# Patient Record
Sex: Female | Born: 1980 | Hispanic: No | Marital: Married | Smoking: Current every day smoker
Health system: Southern US, Community
[De-identification: ages and names within clinical notes are randomized; demographics above are authoritative.]

## PROBLEM LIST (undated history)

## (undated) DIAGNOSIS — R51 Headache: Secondary | ICD-10-CM

## (undated) DIAGNOSIS — F419 Anxiety disorder, unspecified: Secondary | ICD-10-CM

## (undated) DIAGNOSIS — C55 Malignant neoplasm of uterus, part unspecified: Secondary | ICD-10-CM

## (undated) DIAGNOSIS — R519 Headache, unspecified: Secondary | ICD-10-CM

## (undated) DIAGNOSIS — B019 Varicella without complication: Secondary | ICD-10-CM

## (undated) DIAGNOSIS — O139 Gestational [pregnancy-induced] hypertension without significant proteinuria, unspecified trimester: Secondary | ICD-10-CM

## (undated) DIAGNOSIS — K219 Gastro-esophageal reflux disease without esophagitis: Secondary | ICD-10-CM

## (undated) DIAGNOSIS — K5900 Constipation, unspecified: Secondary | ICD-10-CM

## (undated) DIAGNOSIS — D649 Anemia, unspecified: Secondary | ICD-10-CM

## (undated) DIAGNOSIS — Q270 Congenital absence and hypoplasia of umbilical artery: Secondary | ICD-10-CM

## (undated) DIAGNOSIS — M5430 Sciatica, unspecified side: Secondary | ICD-10-CM

## (undated) HISTORY — DX: Constipation, unspecified: K59.00

## (undated) HISTORY — PX: NO PAST SURGERIES: SHX2092

## (undated) HISTORY — DX: Varicella without complication: B01.9

## (undated) HISTORY — DX: Gastro-esophageal reflux disease without esophagitis: K21.9

---

## 2006-12-20 ENCOUNTER — Emergency Department (HOSPITAL_COMMUNITY): Admission: EM | Admit: 2006-12-20 | Discharge: 2006-12-20 | Payer: Self-pay | Admitting: Emergency Medicine

## 2007-03-10 ENCOUNTER — Emergency Department (HOSPITAL_COMMUNITY): Admission: EM | Admit: 2007-03-10 | Discharge: 2007-03-10 | Payer: Self-pay | Admitting: Family Medicine

## 2007-07-27 ENCOUNTER — Emergency Department (HOSPITAL_COMMUNITY): Admission: EM | Admit: 2007-07-27 | Discharge: 2007-07-27 | Payer: Self-pay | Admitting: Emergency Medicine

## 2007-10-10 ENCOUNTER — Emergency Department (HOSPITAL_COMMUNITY): Admission: EM | Admit: 2007-10-10 | Discharge: 2007-10-10 | Payer: Self-pay | Admitting: Emergency Medicine

## 2007-11-12 ENCOUNTER — Emergency Department (HOSPITAL_COMMUNITY): Admission: EM | Admit: 2007-11-12 | Discharge: 2007-11-12 | Payer: Self-pay | Admitting: Emergency Medicine

## 2008-09-20 ENCOUNTER — Emergency Department (HOSPITAL_COMMUNITY): Admission: EM | Admit: 2008-09-20 | Discharge: 2008-09-20 | Payer: Self-pay | Admitting: Emergency Medicine

## 2008-11-10 ENCOUNTER — Emergency Department (HOSPITAL_COMMUNITY): Admission: EM | Admit: 2008-11-10 | Discharge: 2008-11-11 | Payer: Self-pay | Admitting: Emergency Medicine

## 2009-04-23 ENCOUNTER — Encounter: Admission: RE | Admit: 2009-04-23 | Discharge: 2009-04-23 | Payer: Self-pay | Admitting: Cardiovascular Disease

## 2009-05-22 ENCOUNTER — Emergency Department (HOSPITAL_COMMUNITY): Admission: EM | Admit: 2009-05-22 | Discharge: 2009-05-22 | Payer: Self-pay | Admitting: Emergency Medicine

## 2009-12-24 ENCOUNTER — Emergency Department (HOSPITAL_COMMUNITY): Admission: EM | Admit: 2009-12-24 | Discharge: 2009-12-24 | Payer: Self-pay | Admitting: Emergency Medicine

## 2010-01-03 ENCOUNTER — Inpatient Hospital Stay (HOSPITAL_COMMUNITY): Admission: AD | Admit: 2010-01-03 | Discharge: 2010-01-03 | Payer: Self-pay | Admitting: Family Medicine

## 2010-02-24 ENCOUNTER — Inpatient Hospital Stay (HOSPITAL_COMMUNITY)
Admission: AD | Admit: 2010-02-24 | Discharge: 2010-02-24 | Payer: Self-pay | Source: Home / Self Care | Attending: Obstetrics & Gynecology | Admitting: Obstetrics & Gynecology

## 2010-03-28 ENCOUNTER — Inpatient Hospital Stay (HOSPITAL_COMMUNITY)
Admission: AD | Admit: 2010-03-28 | Discharge: 2010-03-29 | Payer: Self-pay | Source: Home / Self Care | Attending: Obstetrics and Gynecology | Admitting: Obstetrics and Gynecology

## 2010-03-29 LAB — RPR: RPR Ser Ql: NONREACTIVE

## 2010-03-29 LAB — CBC
MCV: 89.7 fL (ref 78.0–100.0)
Platelets: 252 10*3/uL (ref 150–400)
RBC: 3.31 MIL/uL — ABNORMAL LOW (ref 3.87–5.11)
RDW: 12.5 % (ref 11.5–15.5)
WBC: 8.7 10*3/uL (ref 4.0–10.5)

## 2010-03-29 LAB — DIFFERENTIAL
Basophils Relative: 0 % (ref 0–1)
Eosinophils Absolute: 0.1 10*3/uL (ref 0.0–0.7)
Eosinophils Relative: 1 % (ref 0–5)
Lymphs Abs: 1.9 10*3/uL (ref 0.7–4.0)
Neutrophils Relative %: 72 % (ref 43–77)

## 2010-03-29 LAB — RUBELLA SCREEN: Rubella: 26.1 IU/mL — ABNORMAL HIGH

## 2010-04-15 ENCOUNTER — Ambulatory Visit: Payer: Medicaid Other

## 2010-04-15 ENCOUNTER — Encounter: Payer: Self-pay | Admitting: Obstetrics & Gynecology

## 2010-04-15 DIAGNOSIS — O0930 Supervision of pregnancy with insufficient antenatal care, unspecified trimester: Secondary | ICD-10-CM

## 2010-04-15 LAB — CONVERTED CEMR LAB: HIV: NONREACTIVE

## 2010-04-16 ENCOUNTER — Other Ambulatory Visit: Payer: Self-pay | Admitting: Obstetrics and Gynecology

## 2010-04-16 ENCOUNTER — Other Ambulatory Visit: Payer: Self-pay

## 2010-04-16 DIAGNOSIS — O0930 Supervision of pregnancy with insufficient antenatal care, unspecified trimester: Secondary | ICD-10-CM

## 2010-04-16 LAB — POCT URINALYSIS DIPSTICK
Hgb urine dipstick: NEGATIVE
Nitrite: NEGATIVE
Protein, ur: NEGATIVE mg/dL
Specific Gravity, Urine: >=1.03 (ref 1.005–1.030)
Urine Glucose, Fasting: NEGATIVE mg/dL
Urobilinogen, UA: 0.2 mg/dL (ref 0.0–1.0)

## 2010-04-24 ENCOUNTER — Encounter: Payer: Self-pay | Admitting: Obstetrics and Gynecology

## 2010-04-24 DIAGNOSIS — Z0189 Encounter for other specified special examinations: Secondary | ICD-10-CM

## 2010-04-30 ENCOUNTER — Other Ambulatory Visit: Payer: Self-pay

## 2010-04-30 DIAGNOSIS — O0930 Supervision of pregnancy with insufficient antenatal care, unspecified trimester: Secondary | ICD-10-CM

## 2010-04-30 LAB — POCT URINALYSIS DIPSTICK
Hgb urine dipstick: NEGATIVE
Nitrite: NEGATIVE
Protein, ur: 30 mg/dL — AB
Specific Gravity, Urine: >=1.03 (ref 1.005–1.030)
Urobilinogen, UA: 0.2 mg/dL (ref 0.0–1.0)

## 2010-05-08 ENCOUNTER — Inpatient Hospital Stay (HOSPITAL_COMMUNITY)
Admission: AD | Admit: 2010-05-08 | Discharge: 2010-05-08 | Disposition: A | Discharge status: Home / Self Care | Payer: Medicaid Other | Source: Ambulatory Visit | Attending: Obstetrics & Gynecology | Admitting: Obstetrics & Gynecology

## 2010-05-08 DIAGNOSIS — O4700 False labor before 37 completed weeks of gestation, unspecified trimester: Secondary | ICD-10-CM | POA: Insufficient documentation

## 2010-05-13 LAB — URINALYSIS, ROUTINE W REFLEX MICROSCOPIC
Bilirubin Urine: NEGATIVE
Hgb urine dipstick: NEGATIVE
Ketones, ur: NEGATIVE mg/dL
Specific Gravity, Urine: >1.03 — ABNORMAL HIGH (ref 1.005–1.030)
pH: 5.5 (ref 5.0–8.0)

## 2010-05-13 LAB — APTT: aPTT: 30 seconds (ref 24–37)

## 2010-05-13 LAB — HCG, QUANTITATIVE, PREGNANCY: hCG, Beta Chain, Quant, S: 11870 m[IU]/mL — ABNORMAL HIGH (ref <5)

## 2010-05-13 LAB — GC/CHLAMYDIA PROBE AMP, GENITAL
Chlamydia, DNA Probe: NEGATIVE
GC Probe Amp, Genital: NEGATIVE

## 2010-05-13 LAB — WET PREP, GENITAL
Clue Cells Wet Prep HPF POC: NONE SEEN
Trich, Wet Prep: NONE SEEN
Yeast Wet Prep HPF POC: NONE SEEN

## 2010-05-13 LAB — CBC
HCT: 32.6 % — ABNORMAL LOW (ref 36.0–46.0)
Hemoglobin: 11.2 g/dL — ABNORMAL LOW (ref 12.0–15.0)
MCH: 32.1 pg (ref 26.0–34.0)
MCV: 93.7 fL (ref 78.0–100.0)
RBC: 3.48 MIL/uL — ABNORMAL LOW (ref 3.87–5.11)

## 2010-05-13 LAB — COMPREHENSIVE METABOLIC PANEL
AST: 20 U/L (ref 0–37)
BUN: 15 mg/dL (ref 6–23)
CO2: 24 mEq/L (ref 19–32)
Chloride: 103 mEq/L (ref 96–112)
Creatinine, Ser: 0.44 mg/dL (ref 0.4–1.2)
GFR calc non Af Amer: >60 mL/min (ref >60)
Glucose, Bld: 83 mg/dL (ref 70–99)
Total Bilirubin: 0.3 mg/dL (ref 0.3–1.2)

## 2010-05-13 LAB — PROTIME-INR
INR: 0.93 (ref 0.00–1.49)
Prothrombin Time: 12.7 s (ref 11.6–15.2)

## 2010-05-13 LAB — ABO/RH: ABO/RH(D): A POS

## 2010-05-13 LAB — TYPE AND SCREEN

## 2010-05-14 ENCOUNTER — Encounter: Payer: Self-pay | Admitting: Obstetrics and Gynecology

## 2010-05-14 ENCOUNTER — Other Ambulatory Visit: Payer: Self-pay

## 2010-05-14 DIAGNOSIS — O0930 Supervision of pregnancy with insufficient antenatal care, unspecified trimester: Secondary | ICD-10-CM

## 2010-05-14 LAB — POCT URINALYSIS DIPSTICK
Glucose, UA: NEGATIVE mg/dL
Hgb urine dipstick: NEGATIVE
Specific Gravity, Urine: 1.025 (ref 1.005–1.030)
Urobilinogen, UA: 1 mg/dL (ref 0.0–1.0)

## 2010-05-14 LAB — CONVERTED CEMR LAB: GC Probe Amp, Genital: NEGATIVE

## 2010-05-15 ENCOUNTER — Encounter: Payer: Self-pay | Admitting: Obstetrics and Gynecology

## 2010-05-21 ENCOUNTER — Other Ambulatory Visit: Payer: Self-pay

## 2010-05-21 DIAGNOSIS — O368190 Decreased fetal movements, unspecified trimester, not applicable or unspecified: Secondary | ICD-10-CM

## 2010-05-21 DIAGNOSIS — O0930 Supervision of pregnancy with insufficient antenatal care, unspecified trimester: Secondary | ICD-10-CM

## 2010-05-21 LAB — POCT URINALYSIS DIP (DEVICE)
Glucose, UA: NEGATIVE mg/dL
Protein, ur: 30 mg/dL — AB
Specific Gravity, Urine: >=1.03 (ref 1.005–1.030)

## 2010-05-26 LAB — CBC
Hemoglobin: 11.5 g/dL — ABNORMAL LOW (ref 12.0–15.0)
RBC: 3.63 MIL/uL — ABNORMAL LOW (ref 3.87–5.11)
WBC: 6.2 10*3/uL (ref 4.0–10.5)

## 2010-05-26 LAB — WET PREP, GENITAL
WBC, Wet Prep HPF POC: NONE SEEN
Yeast Wet Prep HPF POC: NONE SEEN

## 2010-05-26 LAB — URINALYSIS, ROUTINE W REFLEX MICROSCOPIC
Glucose, UA: NEGATIVE mg/dL
Specific Gravity, Urine: 1.027 (ref 1.005–1.030)
pH: 7.5 (ref 5.0–8.0)

## 2010-05-26 LAB — POCT PREGNANCY, URINE: Preg Test, Ur: POSITIVE

## 2010-05-26 LAB — ABO/RH: ABO/RH(D): A POS

## 2010-05-28 ENCOUNTER — Other Ambulatory Visit: Payer: Self-pay | Admitting: Obstetrics and Gynecology

## 2010-05-28 ENCOUNTER — Encounter: Payer: Self-pay | Admitting: Family

## 2010-05-28 DIAGNOSIS — Z331 Pregnant state, incidental: Secondary | ICD-10-CM

## 2010-05-28 DIAGNOSIS — O0930 Supervision of pregnancy with insufficient antenatal care, unspecified trimester: Secondary | ICD-10-CM

## 2010-05-28 LAB — POCT URINALYSIS DIP (DEVICE)
Hgb urine dipstick: NEGATIVE
Nitrite: POSITIVE — AB
Specific Gravity, Urine: >=1.03 (ref 1.005–1.030)
Urobilinogen, UA: 1 mg/dL (ref 0.0–1.0)
pH: 6 (ref 5.0–8.0)

## 2010-06-03 ENCOUNTER — Inpatient Hospital Stay (HOSPITAL_COMMUNITY)
Admission: AD | Admit: 2010-06-03 | Discharge: 2010-06-06 | DRG: 775 | Disposition: A | Discharge status: Home / Self Care | Payer: Medicaid Other | Source: Ambulatory Visit | Attending: Obstetrics & Gynecology | Admitting: Obstetrics & Gynecology

## 2010-06-03 DIAGNOSIS — O4290 Premature rupture of membranes, unspecified as to length of time between rupture and onset of labor, unspecified weeks of gestation: Principal | ICD-10-CM | POA: Diagnosis present

## 2010-06-03 LAB — CBC
Hemoglobin: 9.6 g/dL — ABNORMAL LOW (ref 12.0–15.0)
MCH: 29.2 pg (ref 26.0–34.0)
MCHC: 32.9 g/dL (ref 30.0–36.0)
MCV: 88.8 fL (ref 78.0–100.0)

## 2010-06-04 DIAGNOSIS — O4290 Premature rupture of membranes, unspecified as to length of time between rupture and onset of labor, unspecified weeks of gestation: Secondary | ICD-10-CM

## 2010-06-04 LAB — RPR: RPR Ser Ql: NONREACTIVE

## 2010-06-06 LAB — GC/CHLAMYDIA PROBE AMP, GENITAL: GC Probe Amp, Genital: NEGATIVE

## 2010-06-06 LAB — COMPREHENSIVE METABOLIC PANEL
BUN: 20 mg/dL (ref 6–23)
CO2: 25 mEq/L (ref 19–32)
Calcium: 9.1 mg/dL (ref 8.4–10.5)
Chloride: 101 mEq/L (ref 96–112)
Creatinine, Ser: 0.6 mg/dL (ref 0.4–1.2)
GFR calc Af Amer: >60 mL/min (ref >60)
GFR calc non Af Amer: >60 mL/min (ref >60)
Glucose, Bld: 101 mg/dL — ABNORMAL HIGH (ref 70–99)
Total Bilirubin: 0.6 mg/dL (ref 0.3–1.2)

## 2010-06-06 LAB — DIFFERENTIAL
Basophils Absolute: 0.1 10*3/uL (ref 0.0–0.1)
Eosinophils Relative: 1 % (ref 0–5)
Lymphocytes Relative: 23 % (ref 12–46)
Lymphs Abs: 2.1 10*3/uL (ref 0.7–4.0)
Neutro Abs: 6.4 10*3/uL (ref 1.7–7.7)
Neutrophils Relative %: 70 % (ref 43–77)

## 2010-06-06 LAB — URINALYSIS, ROUTINE W REFLEX MICROSCOPIC
Bilirubin Urine: NEGATIVE
Hgb urine dipstick: NEGATIVE
Ketones, ur: NEGATIVE mg/dL
Nitrite: NEGATIVE
pH: 6 (ref 5.0–8.0)

## 2010-06-06 LAB — CBC
HCT: 39.8 % (ref 36.0–46.0)
Hemoglobin: 13.4 g/dL (ref 12.0–15.0)
MCHC: 33.6 g/dL (ref 30.0–36.0)
MCV: 93.8 fL (ref 78.0–100.0)
RBC: 4.24 MIL/uL (ref 3.87–5.11)
WBC: 9.1 10*3/uL (ref 4.0–10.5)

## 2010-06-06 LAB — WET PREP, GENITAL
Trich, Wet Prep: NONE SEEN
WBC, Wet Prep HPF POC: NONE SEEN
Yeast Wet Prep HPF POC: NONE SEEN

## 2010-06-08 ENCOUNTER — Inpatient Hospital Stay (HOSPITAL_COMMUNITY)
Admission: AD | Admit: 2010-06-08 | Discharge: 2010-06-08 | Disposition: A | Discharge status: Home / Self Care | Payer: Medicaid Other | Source: Ambulatory Visit | Attending: Obstetrics & Gynecology | Admitting: Obstetrics & Gynecology

## 2010-06-08 DIAGNOSIS — K5900 Constipation, unspecified: Secondary | ICD-10-CM | POA: Insufficient documentation

## 2010-06-08 DIAGNOSIS — O99893 Other specified diseases and conditions complicating puerperium: Secondary | ICD-10-CM | POA: Insufficient documentation

## 2010-06-09 ENCOUNTER — Emergency Department (HOSPITAL_COMMUNITY): Payer: Medicaid Other

## 2010-06-09 ENCOUNTER — Emergency Department (HOSPITAL_COMMUNITY)
Admission: EM | Admit: 2010-06-09 | Discharge: 2010-06-09 | Disposition: A | Discharge status: Home / Self Care | Payer: Medicaid Other | Attending: Emergency Medicine | Admitting: Emergency Medicine

## 2010-06-09 DIAGNOSIS — K5900 Constipation, unspecified: Secondary | ICD-10-CM | POA: Insufficient documentation

## 2010-06-09 DIAGNOSIS — R110 Nausea: Secondary | ICD-10-CM | POA: Insufficient documentation

## 2010-06-09 DIAGNOSIS — R109 Unspecified abdominal pain: Secondary | ICD-10-CM | POA: Insufficient documentation

## 2010-06-09 LAB — DIFFERENTIAL
Basophils Absolute: 0 10*3/uL (ref 0.0–0.1)
Lymphocytes Relative: 28 % (ref 12–46)
Lymphs Abs: 2.1 10*3/uL (ref 0.7–4.0)
Monocytes Absolute: 0.6 10*3/uL (ref 0.1–1.0)
Neutro Abs: 4.7 10*3/uL (ref 1.7–7.7)

## 2010-06-09 LAB — POCT I-STAT, CHEM 8
Chloride: 110 mEq/L (ref 96–112)
Creatinine, Ser: 0.6 mg/dL (ref 0.4–1.2)
Hemoglobin: 10.2 g/dL — ABNORMAL LOW (ref 12.0–15.0)
Potassium: 3.6 mEq/L (ref 3.5–5.1)
Sodium: 142 mEq/L (ref 135–145)

## 2010-06-09 LAB — CBC
HCT: 31.6 % — ABNORMAL LOW (ref 36.0–46.0)
Hemoglobin: 10.2 g/dL — ABNORMAL LOW (ref 12.0–15.0)
MCHC: 32.3 g/dL (ref 30.0–36.0)
WBC: 7.5 10*3/uL (ref 4.0–10.5)

## 2010-11-26 LAB — DIFFERENTIAL
Lymphocytes Relative: 26
Lymphs Abs: 2
Neutrophils Relative %: 68

## 2010-11-26 LAB — WET PREP, GENITAL
Trich, Wet Prep: NONE SEEN
WBC, Wet Prep HPF POC: NONE SEEN
Yeast Wet Prep HPF POC: NONE SEEN

## 2010-11-26 LAB — RPR: RPR Ser Ql: NONREACTIVE

## 2010-11-26 LAB — CBC
Platelets: 244
WBC: 7.9

## 2010-11-26 LAB — POCT PREGNANCY, URINE: Operator id: 272551

## 2010-11-26 LAB — GC/CHLAMYDIA PROBE AMP, GENITAL: GC Probe Amp, Genital: NEGATIVE

## 2010-12-03 LAB — POCT I-STAT, CHEM 8
BUN: 23
Creatinine, Ser: 0.7
Sodium: 139
TCO2: 21

## 2010-12-03 LAB — URINALYSIS, ROUTINE W REFLEX MICROSCOPIC
Bilirubin Urine: NEGATIVE
Ketones, ur: NEGATIVE
Nitrite: NEGATIVE
Urobilinogen, UA: 0.2

## 2010-12-03 LAB — PREGNANCY, URINE: Preg Test, Ur: NEGATIVE

## 2012-02-12 ENCOUNTER — Emergency Department (HOSPITAL_COMMUNITY)
Admission: EM | Admit: 2012-02-12 | Discharge: 2012-02-13 | Disposition: A | Discharge status: Home / Self Care | Payer: 59 | Attending: Emergency Medicine | Admitting: Emergency Medicine

## 2012-02-12 ENCOUNTER — Encounter (HOSPITAL_COMMUNITY): Payer: Self-pay | Admitting: *Deleted

## 2012-02-12 DIAGNOSIS — K529 Noninfective gastroenteritis and colitis, unspecified: Secondary | ICD-10-CM

## 2012-02-12 DIAGNOSIS — K5289 Other specified noninfective gastroenteritis and colitis: Secondary | ICD-10-CM | POA: Insufficient documentation

## 2012-02-12 DIAGNOSIS — R197 Diarrhea, unspecified: Secondary | ICD-10-CM | POA: Insufficient documentation

## 2012-02-12 DIAGNOSIS — R51 Headache: Secondary | ICD-10-CM | POA: Insufficient documentation

## 2012-02-12 DIAGNOSIS — R112 Nausea with vomiting, unspecified: Secondary | ICD-10-CM | POA: Insufficient documentation

## 2012-02-12 LAB — PREGNANCY, URINE: Preg Test, Ur: NEGATIVE

## 2012-02-12 LAB — CBC WITH DIFFERENTIAL/PLATELET
HCT: 38 % (ref 36.0–46.0)
Hemoglobin: 12.6 g/dL (ref 12.0–15.0)
Lymphocytes Relative: 27 % (ref 12–46)
Lymphs Abs: 2.7 10*3/uL (ref 0.7–4.0)
MCHC: 33.2 g/dL (ref 30.0–36.0)
Monocytes Absolute: 0.5 10*3/uL (ref 0.1–1.0)
Monocytes Relative: 6 % (ref 3–12)
Neutro Abs: 6.3 10*3/uL (ref 1.7–7.7)
RBC: 4.28 MIL/uL (ref 3.87–5.11)
WBC: 9.7 10*3/uL (ref 4.0–10.5)

## 2012-02-12 LAB — COMPREHENSIVE METABOLIC PANEL
Alkaline Phosphatase: 62 U/L (ref 39–117)
BUN: 21 mg/dL (ref 6–23)
CO2: 29 mEq/L (ref 19–32)
Chloride: 107 mEq/L (ref 96–112)
Creatinine, Ser: 0.7 mg/dL (ref 0.50–1.10)
GFR calc non Af Amer: >90 mL/min (ref >90)
Glucose, Bld: 97 mg/dL (ref 70–99)
Potassium: 3.8 mEq/L (ref 3.5–5.1)
Total Bilirubin: 0.2 mg/dL — ABNORMAL LOW (ref 0.3–1.2)

## 2012-02-12 LAB — URINALYSIS, ROUTINE W REFLEX MICROSCOPIC
Glucose, UA: NEGATIVE mg/dL
Hgb urine dipstick: NEGATIVE
Protein, ur: NEGATIVE mg/dL
pH: 5.5 (ref 5.0–8.0)

## 2012-02-12 LAB — LIPASE, BLOOD: Lipase: 31 U/L (ref 11–59)

## 2012-02-12 LAB — OCCULT BLOOD, POC DEVICE: Fecal Occult Bld: NEGATIVE

## 2012-02-12 LAB — URINE MICROSCOPIC-ADD ON

## 2012-02-12 MED ORDER — SODIUM CHLORIDE 0.9 % IV BOLUS (SEPSIS)
1000.0000 mL | Freq: Once | INTRAVENOUS | Status: AC
  Administered 2012-02-12: 1000 mL via INTRAVENOUS

## 2012-02-12 MED ORDER — ONDANSETRON HCL 4 MG/2ML IJ SOLN
4.0000 mg | Freq: Once | INTRAMUSCULAR | Status: AC
  Administered 2012-02-12: 4 mg via INTRAVENOUS
  Filled 2012-02-12: qty 2

## 2012-02-12 MED ORDER — KETOROLAC TROMETHAMINE 30 MG/ML IJ SOLN
30.0000 mg | Freq: Once | INTRAMUSCULAR | Status: AC
  Administered 2012-02-12: 30 mg via INTRAVENOUS
  Filled 2012-02-12: qty 1

## 2012-02-12 NOTE — ED Notes (Signed)
The pt has had abd cramps with nv and diarrhea.  Today her stools are black.  She is c/o a headache for a year also lmp  Nov on depo shot

## 2012-02-12 NOTE — ED Provider Notes (Signed)
History     CSN: [id number]  Arrival date & time 02/12/12  1955   First MD Initiated Contact with Patient 02/12/12 2325      Chief Complaint  Patient presents with  . Abdominal Pain    (Consider location/radiation/quality/duration/timing/severity/associated sxs/prior treatment) HPI Comments: Pt c/o n/v and watery diarrhea X 4 days ago - initially was watery, yesterday had mucuous and today had black stools - still watery with some formed element.  She has also noticed occasional coffee-ground type emesis over the last several days. Now is having headaches - headaches daily X 1 year, intially was taking tylenol, changed to ibuprofen 400mg  or less a day but does take it daily.  She is still nauseated and vomiting and has associated abdominal pain.  The pain is located in the lower abdomen bialterally, sometiems on the top of thea bdomen.  No hx of surgery.  No hx of PUD.  Has had reflux sx but does not carry a diagnosis.  Has taken zantac in the past.  Seh is now not making much urine - iti s dark  Patient is a 31 y.o. female presenting with abdominal pain. The history is provided by the patient.  Abdominal Pain The primary symptoms of the illness include abdominal pain.    History reviewed. No pertinent past medical history.  History reviewed. No pertinent past surgical history.  No family history on file.  History  Substance Use Topics  . Smoking status: Never Smoker   . Smokeless tobacco: Not on file  . Alcohol Use: No    OB History    Grav Para Term Preterm Abortions TAB SAB Ect Mult Living                  Review of Systems  Gastrointestinal: Positive for abdominal pain.  All other systems reviewed and are negative.    Allergies  Bee venom  Home Medications   Current Outpatient Rx  Name  Route  Sig  Dispense  Refill  . ACETAMINOPHEN 500 MG PO TABS   Oral   Take 1,000 mg by mouth every 6 (six) hours as needed. For headache         . IBUPROFEN 800 MG PO  TABS   Oral   Take 800 mg by mouth every 8 (eight) hours as needed. For headache         . CIPROFLOXACIN HCL 500 MG PO TABS   Oral   Take 1 tablet (500 mg total) by mouth every 12 (twelve) hours.   20 tablet   0   . METRONIDAZOLE 500 MG PO TABS   Oral   Take 1 tablet (500 mg total) by mouth 2 (two) times daily.   14 tablet   0   . ONDANSETRON 4 MG PO TBDP   Oral   Take 1 tablet (4 mg total) by mouth every 8 (eight) hours as needed for nausea.   10 tablet   0   . OXYCODONE-ACETAMINOPHEN 5-325 MG PO TABS   Oral   Take 1 tablet by mouth every 4 (four) hours as needed for pain.   20 tablet   0     BP 106/64  Pulse 85  Temp 97.6 F (36.4 C) (Oral)  Resp 11  SpO2 100%  LMP 01/14/2012  Physical Exam  Nursing note and vitals reviewed. Constitutional: She appears well-developed and well-nourished. No distress.  HENT:  Head: Normocephalic and atraumatic.  Mouth/Throat: Oropharynx is clear and moist. No oropharyngeal  exudate.       Moist mucous membranes  Eyes: Conjunctivae normal and EOM are normal. Pupils are equal, round, and reactive to light. Right eye exhibits no discharge. Left eye exhibits no discharge. No scleral icterus.  Neck: Normal range of motion. Neck supple. No JVD present. No thyromegaly present.  Cardiovascular: Regular rhythm, normal heart sounds and intact distal pulses.  Exam reveals no gallop and no friction rub.   No murmur heard.      Mild tachycardia, strong peripheral pulses  Pulmonary/Chest: Effort normal and breath sounds normal. No respiratory distress. She has no wheezes. She has no rales.  Abdominal: Soft. Bowel sounds are normal. She exhibits no distension and no mass. There is tenderness ( Very soft abdomen, no peritoneal signs, diffuse abdominal tenderness without guarding or masses).  Genitourinary:       Chaperone present for entire exam, rectal exam performed, no fissures, hemorrhoids, masses or tenderness. Stool was mucousy and  greenish black, scant amount of stool in the rectal vault, no formed stool  Musculoskeletal: Normal range of motion. She exhibits no edema and no tenderness.  Lymphadenopathy:    She has no cervical adenopathy.  Neurological: She is alert. Coordination normal.       Pupils are equal round reactive, cranial nerves III through XII are intact, the patient has a normal steady gait, moves all strumming is x4 without ataxia or weakness, speech is clear, memory is intact.  Skin: Skin is warm and dry. No rash noted. No erythema.  Psychiatric: She has a normal mood and affect. Her behavior is normal.    ED Course  Procedures (including critical care time)  Labs Reviewed  URINALYSIS, ROUTINE W REFLEX MICROSCOPIC - Abnormal; Notable for the following:    Specific Gravity, Urine 1.036 (*)     Ketones, ur 15 (*)     Leukocytes, UA TRACE (*)     All other components within normal limits  COMPREHENSIVE METABOLIC PANEL - Abnormal; Notable for the following:    Total Bilirubin 0.2 (*)     All other components within normal limits  URINE MICROSCOPIC-ADD ON - Abnormal; Notable for the following:    Squamous Epithelial / LPF FEW (*)     Bacteria, UA MANY (*)     All other components within normal limits  PREGNANCY, URINE  CBC WITH DIFFERENTIAL  LIPASE, BLOOD  OCCULT BLOOD, POC DEVICE  URINE CULTURE  OCCULT BLOOD X 1 CARD TO LAB, STOOL   Ct Head Wo Contrast  02/13/2012  *RADIOLOGY REPORT*  Clinical Data: Daily headaches over past year getting worse  CT HEAD WITHOUT CONTRAST  Technique:  Contiguous axial images were obtained from the base of the skull through the vertex without contrast.  Comparison: 11/12/2007  Findings: Normal ventricular morphology. No midline shift or mass effect. Normal appearance of brain parenchyma. No intracranial hemorrhage, mass lesion, or acute infarction. Visualized paranasal sinuses and mastoid air cells clear. Bones unremarkable.  IMPRESSION: No acute intracranial  abnormalities.   Original Report Authenticated By: Ulyses Southward, M.D.      1. Colitis       MDM  The patient by history has had a gastroenteritis with a colitis and now appears to be having dark-colored stools consistent with an upper GI bleed. This would be consistent with her history of record NSAID use as well as frequent and recurrent vomiting over the last couple of days. IV fluids, and IV antiemetics.  The patient also complains of a constant headache  daily over the last year which is gradually getting worse. She is not complaining of any neurologic abnormalities, she was told by her employer at the urgent care that she did have an MRI which she has not yet arranged.   The patient has been reevaluated, she no longer has any abdominal pain. Due to her history of anti-inflammatory use and abnormal stools I will start her on Pepcid for stomach acid, she will be sent home with to grow, Flagyl, Percocet and Zofran for her other symptoms which is likely related to colitis. Her vital signs are normal, pulse is less than 100 and blood pressure is normal as well. She states that she feels much better and will return should her symptoms worsen.  CT scan of the brain normal without any signs of intracranial abnormalities.  Vida Roller, MD 02/13/12 [phone]

## 2012-02-12 NOTE — ED Notes (Signed)
MD at bedside.miller 

## 2012-02-13 ENCOUNTER — Emergency Department (HOSPITAL_COMMUNITY): Payer: 59

## 2012-02-13 MED ORDER — METRONIDAZOLE 500 MG PO TABS: 500.0000 mg | ORAL_TABLET | Freq: Two times a day (BID) | ORAL | Status: DC

## 2012-02-13 MED ORDER — ACETAMINOPHEN 500 MG PO TABS: 1000.0000 mg | ORAL_TABLET | Freq: Once | ORAL | Status: DC

## 2012-02-13 MED ORDER — HYDROMORPHONE HCL PF 1 MG/ML IJ SOLN
1.0000 mg | Freq: Once | INTRAMUSCULAR | Status: AC
  Administered 2012-02-13: 1 mg via INTRAVENOUS
  Filled 2012-02-13: qty 1

## 2012-02-13 MED ORDER — ONDANSETRON 4 MG PO TBDP: 4.0000 mg | ORAL_TABLET | Freq: Three times a day (TID) | ORAL | Status: DC | PRN

## 2012-02-13 MED ORDER — FAMOTIDINE 20 MG PO TABS: 20.0000 mg | ORAL_TABLET | Freq: Two times a day (BID) | ORAL | Status: DC

## 2012-02-13 MED ORDER — CIPROFLOXACIN HCL 500 MG PO TABS: 500.0000 mg | ORAL_TABLET | Freq: Two times a day (BID) | ORAL | Status: DC

## 2012-02-13 MED ORDER — METRONIDAZOLE 500 MG PO TABS
500.0000 mg | ORAL_TABLET | Freq: Once | ORAL | Status: AC
  Administered 2012-02-13: 500 mg via ORAL
  Filled 2012-02-13: qty 1

## 2012-02-13 MED ORDER — CIPROFLOXACIN IN D5W 400 MG/200ML IV SOLN
400.0000 mg | Freq: Once | INTRAVENOUS | Status: AC
  Administered 2012-02-13: 400 mg via INTRAVENOUS
  Filled 2012-02-13: qty 200

## 2012-02-13 MED ORDER — OXYCODONE-ACETAMINOPHEN 5-325 MG PO TABS: 1.0000 | ORAL_TABLET | ORAL | Status: DC | PRN

## 2012-02-13 NOTE — ED Notes (Signed)
Patient transported to CT 

## 2012-02-14 LAB — URINE CULTURE: Colony Count: 30000

## 2013-02-27 ENCOUNTER — Encounter (HOSPITAL_COMMUNITY): Payer: Self-pay | Admitting: Emergency Medicine

## 2013-02-27 ENCOUNTER — Emergency Department (HOSPITAL_COMMUNITY)
Admission: EM | Admit: 2013-02-27 | Discharge: 2013-02-28 | Disposition: A | Discharge status: Home / Self Care | Payer: 59 | Attending: Emergency Medicine | Admitting: Emergency Medicine

## 2013-02-27 DIAGNOSIS — Z3490 Encounter for supervision of normal pregnancy, unspecified, unspecified trimester: Secondary | ICD-10-CM

## 2013-02-27 DIAGNOSIS — R109 Unspecified abdominal pain: Secondary | ICD-10-CM | POA: Insufficient documentation

## 2013-02-27 DIAGNOSIS — O219 Vomiting of pregnancy, unspecified: Secondary | ICD-10-CM

## 2013-02-27 DIAGNOSIS — O210 Mild hyperemesis gravidarum: Secondary | ICD-10-CM | POA: Insufficient documentation

## 2013-02-27 DIAGNOSIS — R42 Dizziness and giddiness: Secondary | ICD-10-CM | POA: Insufficient documentation

## 2013-02-27 LAB — CBC WITH DIFFERENTIAL/PLATELET
Eosinophils Absolute: 0.1 10*3/uL (ref 0.0–0.7)
Eosinophils Relative: 1 % (ref 0–5)
HCT: 37.3 % (ref 36.0–46.0)
Hemoglobin: 12.4 g/dL (ref 12.0–15.0)
Lymphocytes Relative: 21 % (ref 12–46)
Lymphs Abs: 2.2 10*3/uL (ref 0.7–4.0)
MCH: 29.2 pg (ref 26.0–34.0)
MCV: 88 fL (ref 78.0–100.0)
Monocytes Absolute: 0.5 10*3/uL (ref 0.1–1.0)
Monocytes Relative: 5 % (ref 3–12)
Platelets: 290 10*3/uL (ref 150–400)
RBC: 4.24 MIL/uL (ref 3.87–5.11)

## 2013-02-27 LAB — URINALYSIS, ROUTINE W REFLEX MICROSCOPIC
Bilirubin Urine: NEGATIVE
Glucose, UA: NEGATIVE mg/dL
Ketones, ur: NEGATIVE mg/dL
Protein, ur: NEGATIVE mg/dL
pH: 5.5 (ref 5.0–8.0)

## 2013-02-27 LAB — COMPREHENSIVE METABOLIC PANEL
AST: 12 U/L (ref 0–37)
Albumin: 4 g/dL (ref 3.5–5.2)
Chloride: 103 mEq/L (ref 96–112)
Creatinine, Ser: 0.63 mg/dL (ref 0.50–1.10)
Total Bilirubin: 0.2 mg/dL — ABNORMAL LOW (ref 0.3–1.2)
Total Protein: 7.6 g/dL (ref 6.0–8.3)

## 2013-02-27 LAB — URINE MICROSCOPIC-ADD ON

## 2013-02-27 LAB — LIPASE, BLOOD: Lipase: 25 U/L (ref 11–59)

## 2013-02-27 NOTE — ED Notes (Signed)
sts for the past few week sts dizziness, HA, vomiting, swelling in feet and ankles. sts bruising easy and gaining weight. sts lethargic. Pt has multiple complaints.

## 2013-02-28 MED ORDER — PRENATAL COMPLETE 14-0.4 MG PO TABS: 1.0000 | ORAL_TABLET | Freq: Every day | ORAL | Status: DC

## 2013-02-28 MED ORDER — ONDANSETRON HCL 4 MG PO TABS: 4.0000 mg | ORAL_TABLET | Freq: Four times a day (QID) | ORAL | Status: DC

## 2013-02-28 NOTE — ED Provider Notes (Signed)
CSN: [id number]     Arrival date & time 02/27/13  1735 History   First MD Initiated Contact with Patient 02/28/13 0005     Chief Complaint  Patient presents with  . Vomiting  . Dizziness  . Abdominal Cramping   (Consider location/radiation/quality/duration/timing/severity/associated sxs/prior Treatment) Patient is a 32 y.o. female presenting with dizziness and cramps. The history is provided by the patient.  Dizziness Abdominal Cramping  She has been having lightheadedness, nausea, vomiting intermittently over the last several weeks but it has been getting worse. She does notice some swelling in her ankles. She is noted that she is bruising more easily. She is worried that she is going through premature menopause. Last menses was November 20 and was shorter than normal. Last normal menses was in October. She had been on Depo-Provera but last dose was in April. She has 5 children with no miscarriages or abortions. She's not had anything to treat her current symptoms. Denies fever or chills. Denies chest pain.  History reviewed. No pertinent past medical history. History reviewed. No pertinent past surgical history. History reviewed. No pertinent family history. History  Substance Use Topics  . Smoking status: Never Smoker   . Smokeless tobacco: Not on file  . Alcohol Use: No   OB History   Grav Para Term Preterm Abortions TAB SAB Ect Mult Living                 Review of Systems  Neurological: Positive for dizziness.  All other systems reviewed and are negative.    Allergies  Bee venom and Adhesive  Home Medications   Current Outpatient Rx  Name  Route  Sig  Dispense  Refill  . acetaminophen (TYLENOL) 500 MG tablet   Oral   Take 1,000 mg by mouth every 6 (six) hours as needed. For headache         . ibuprofen (ADVIL,MOTRIN) 800 MG tablet   Oral   Take 800 mg by mouth every 8 (eight) hours as needed. For headache          BP 129/70  Pulse 121  Temp(Src) 98.8  F (37.1 C)  Resp 18  Ht 5\' 4"  (1.626 m)  Wt 227 lb (102.967 kg)  BMI 38.95 kg/m2  SpO2 98%  LMP 01/25/2013 Physical Exam  Nursing note and vitals reviewed.  32 year old female, resting comfortably and in no acute distress. Vital signs are normal. Oxygen saturation is 98%, which is normal. Head is normocephalic and atraumatic. PERRLA, EOMI. Oropharynx is clear. Neck is nontender and supple without adenopathy or JVD. Back is nontender and there is no CVA tenderness. Lungs are clear without rales, wheezes, or rhonchi. Chest is nontender. Heart has regular rate and rhythm without murmur. Abdomen is soft, flat, nontender without masses or hepatosplenomegaly and peristalsis is normoactive. Extremities have trace edema, full range of motion is present. Skin is warm and dry without rash. Neurologic: Mental status is normal, cranial nerves are intact, there are no motor or sensory deficits.  ED Course  Procedures (including critical care time) Labs Review Results for orders placed during the hospital encounter of 02/27/13  LIPASE, BLOOD      Result Value Range   Lipase 25  11 - 59 U/L  COMPREHENSIVE METABOLIC PANEL      Result Value Range   Sodium 138  135 - 145 mEq/L   Potassium 3.8  3.5 - 5.1 mEq/L   Chloride 103  96 - 112 mEq/L  CO2 24  19 - 32 mEq/L   Glucose, Bld 92  70 - 99 mg/dL   BUN 18  6 - 23 mg/dL   Creatinine, Ser 9.81  0.50 - 1.10 mg/dL   Calcium 8.9  8.4 - 19.1 mg/dL   Total Protein 7.6  6.0 - 8.3 g/dL   Albumin 4.0  3.5 - 5.2 g/dL   AST 12  0 - 37 U/L   ALT 12  0 - 35 U/L   Alkaline Phosphatase 79  39 - 117 U/L   Total Bilirubin 0.2 (*) 0.3 - 1.2 mg/dL   GFR calc non Af Amer >90  >90 mL/min   GFR calc Af Amer >90  >90 mL/min  CBC WITH DIFFERENTIAL      Result Value Range   WBC 10.7 (*) 4.0 - 10.5 K/uL   RBC 4.24  3.87 - 5.11 MIL/uL   Hemoglobin 12.4  12.0 - 15.0 g/dL   HCT 47.8  29.5 - 62.1 %   MCV 88.0  78.0 - 100.0 fL   MCH 29.2  26.0 - 34.0 pg   MCHC  33.2  30.0 - 36.0 g/dL   RDW 30.8  65.7 - 84.6 %   Platelets 290  150 - 400 K/uL   Neutrophils Relative % 73  43 - 77 %   Neutro Abs 7.8 (*) 1.7 - 7.7 K/uL   Lymphocytes Relative 21  12 - 46 %   Lymphs Abs 2.2  0.7 - 4.0 K/uL   Monocytes Relative 5  3 - 12 %   Monocytes Absolute 0.5  0.1 - 1.0 K/uL   Eosinophils Relative 1  0 - 5 %   Eosinophils Absolute 0.1  0.0 - 0.7 K/uL   Basophils Relative 0  0 - 1 %   Basophils Absolute 0.0  0.0 - 0.1 K/uL  URINALYSIS, ROUTINE W REFLEX MICROSCOPIC      Result Value Range   Color, Urine YELLOW  YELLOW   APPearance CLOUDY (*) CLEAR   Specific Gravity, Urine 1.026  1.005 - 1.030   pH 5.5  5.0 - 8.0   Glucose, UA NEGATIVE  NEGATIVE mg/dL   Hgb urine dipstick SMALL (*) NEGATIVE   Bilirubin Urine NEGATIVE  NEGATIVE   Ketones, ur NEGATIVE  NEGATIVE mg/dL   Protein, ur NEGATIVE  NEGATIVE mg/dL   Urobilinogen, UA 0.2  0.0 - 1.0 mg/dL   Nitrite NEGATIVE  NEGATIVE   Leukocytes, UA NEGATIVE  NEGATIVE  URINE MICROSCOPIC-ADD ON      Result Value Range   Squamous Epithelial / LPF MANY (*) RARE   WBC, UA 0-2  <3 WBC/hpf   RBC / HPF 0-2  <3 RBC/hpf   Bacteria, UA FEW (*) RARE   Urine-Other MUCOUS PRESENT    POCT PREGNANCY, URINE      Result Value Range   Preg Test, Ur POSITIVE (*) NEGATIVE    MDM   1. Pregnancy   2. Nausea/vomiting in pregnancy    Nausea and lightheadedness which apparently are secondary to pregnancy. Urine pregnancy tests come out positive. Remainder of labs are unremarkable. Patient is advised to obtain prenatal care. She's discharged with prescription for ondansetron and prenatal vitamins.    Dione Booze, MD 02/28/13 [phone]

## 2013-03-13 ENCOUNTER — Emergency Department (HOSPITAL_COMMUNITY)
Admission: EM | Admit: 2013-03-13 | Discharge: 2013-03-13 | Disposition: A | Discharge status: Home / Self Care | Payer: Medicaid Other | Attending: Emergency Medicine | Admitting: Emergency Medicine

## 2013-03-13 ENCOUNTER — Emergency Department (HOSPITAL_COMMUNITY): Payer: Medicaid Other

## 2013-03-13 ENCOUNTER — Encounter (HOSPITAL_COMMUNITY): Payer: Self-pay | Admitting: Emergency Medicine

## 2013-03-13 DIAGNOSIS — O2390 Unspecified genitourinary tract infection in pregnancy, unspecified trimester: Secondary | ICD-10-CM | POA: Insufficient documentation

## 2013-03-13 DIAGNOSIS — N390 Urinary tract infection, site not specified: Secondary | ICD-10-CM

## 2013-03-13 DIAGNOSIS — O209 Hemorrhage in early pregnancy, unspecified: Secondary | ICD-10-CM

## 2013-03-13 DIAGNOSIS — Z79899 Other long term (current) drug therapy: Secondary | ICD-10-CM | POA: Insufficient documentation

## 2013-03-13 LAB — TYPE AND SCREEN
ABO/RH(D): A POS
Antibody Screen: NEGATIVE

## 2013-03-13 LAB — BASIC METABOLIC PANEL
BUN: 14 mg/dL (ref 6–23)
CO2: 25 mEq/L (ref 19–32)
Calcium: 9 mg/dL (ref 8.4–10.5)
Chloride: 106 mEq/L (ref 96–112)
Creatinine, Ser: 0.53 mg/dL (ref 0.50–1.10)
GFR calc Af Amer: >90 mL/min (ref >90)
GFR calc non Af Amer: >90 mL/min (ref >90)
Glucose, Bld: 98 mg/dL (ref 70–99)
Potassium: 3.8 mEq/L (ref 3.7–5.3)
Sodium: 145 mEq/L (ref 137–147)

## 2013-03-13 LAB — CBC
HEMATOCRIT: 34.1 % — AB (ref 36.0–46.0)
HEMOGLOBIN: 11.4 g/dL — AB (ref 12.0–15.0)
MCH: 29 pg (ref 26.0–34.0)
MCHC: 33.4 g/dL (ref 30.0–36.0)
MCV: 86.8 fL (ref 78.0–100.0)
Platelets: 294 10*3/uL (ref 150–400)
RBC: 3.93 MIL/uL (ref 3.87–5.11)
RDW: 13.7 % (ref 11.5–15.5)
WBC: 6.5 10*3/uL (ref 4.0–10.5)

## 2013-03-13 LAB — URINALYSIS, ROUTINE W REFLEX MICROSCOPIC
Glucose, UA: NEGATIVE mg/dL
Ketones, ur: 15 mg/dL — AB
Nitrite: POSITIVE — AB
Protein, ur: 100 mg/dL — AB
Specific Gravity, Urine: 1.026 (ref 1.005–1.030)
Urobilinogen, UA: 1 mg/dL (ref 0.0–1.0)
pH: 6.5 (ref 5.0–8.0)

## 2013-03-13 LAB — URINE MICROSCOPIC-ADD ON

## 2013-03-13 LAB — HCG, QUANTITATIVE, PREGNANCY: hCG, Beta Chain, Quant, S: 732 m[IU]/mL — ABNORMAL HIGH (ref <5)

## 2013-03-13 LAB — POCT PREGNANCY, URINE: PREG TEST UR: POSITIVE — AB

## 2013-03-13 MED ORDER — DEXTROSE 5 % IV SOLN
1.0000 g | Freq: Once | INTRAVENOUS | Status: AC
  Administered 2013-03-13: 1 g via INTRAVENOUS
  Filled 2013-03-13: qty 10

## 2013-03-13 MED ORDER — CEPHALEXIN 500 MG PO CAPS: 500.0000 mg | ORAL_CAPSULE | Freq: Four times a day (QID) | ORAL | Status: DC

## 2013-03-13 NOTE — ED Notes (Signed)
Patient transported to Ultrasound 

## 2013-03-13 NOTE — ED Provider Notes (Signed)
CSN: [id number]     Arrival date & time 03/13/13  0813 History   First MD Initiated Contact with Patient 03/13/13 [phone]     Chief Complaint  Patient presents with  . Vaginal Bleeding   (Consider location/radiation/quality/duration/timing/severity/associated sxs/prior Treatment) HPI Patient presents emergency department with vaginal bleeding, that started yesterday.  The patient, states she found out that she was pregnant [redacted] weeks ago.  Patient, states she's not had any significant lower abdominal pain.  The patient denies headache, blurred vision, chest pain, shortness of breath, weakness, dizziness, neck pain, dysuria, nausea, or vomiting.  The patient also denies fever History reviewed. No pertinent past medical history. History reviewed. No pertinent past surgical history. No family history on file. History  Substance Use Topics  . Smoking status: Never Smoker   . Smokeless tobacco: Not on file  . Alcohol Use: No   OB History   Grav Para Term Preterm Abortions TAB SAB Ect Mult Living                 Review of Systems All other systems negative except as documented in the HPI. All pertinent positives and negatives as reviewed in the HPI. Allergies  Bee venom and Adhesive  Home Medications   Current Outpatient Rx  Name  Route  Sig  Dispense  Refill  . acetaminophen (TYLENOL) 500 MG tablet   Oral   Take 1,000 mg by mouth every 6 (six) hours as needed. For headache         . Prenatal Vit-Fe Fumarate-FA (PRENATAL COMPLETE) 14-0.4 MG TABS   Oral   Take 1 tablet by mouth daily.   30 each   0    BP 134/72  Pulse 99  Temp(Src) 98.1 F (36.7 C) (Oral)  Resp 20  SpO2 100%  LMP 01/25/2013 Physical Exam  Nursing note and vitals reviewed. Constitutional: She is oriented to person, place, and time. She appears well-developed and well-nourished.  HENT:  Head: Normocephalic and atraumatic.  Mouth/Throat: Oropharynx is clear and moist.  Cardiovascular: Normal rate, regular  rhythm and normal heart sounds.  Exam reveals no gallop and no friction rub.   No murmur heard. Pulmonary/Chest: Effort normal and breath sounds normal.  Genitourinary: Cervix exhibits no motion tenderness. Right adnexum displays no mass. Left adnexum displays no mass. There is bleeding around the vagina.  Neurological: She is alert and oriented to person, place, and time.    ED Course  Procedures (including critical care time) Labs Review Labs Reviewed  CBC - Abnormal; Notable for the following:    Hemoglobin 11.4 (*)    HCT 34.1 (*)    All other components within normal limits  HCG, QUANTITATIVE, PREGNANCY - Abnormal; Notable for the following:    hCG, Beta Chain, Quant, S 732 (*)    All other components within normal limits  URINALYSIS, ROUTINE W REFLEX MICROSCOPIC - Abnormal; Notable for the following:    Color, Urine AMBER (*)    APPearance CLOUDY (*)    Hgb urine dipstick LARGE (*)    Bilirubin Urine SMALL (*)    Ketones, ur 15 (*)    Protein, ur 100 (*)    Nitrite POSITIVE (*)    Leukocytes, UA MODERATE (*)    All other components within normal limits  URINE MICROSCOPIC-ADD ON - Abnormal; Notable for the following:    Squamous Epithelial / LPF FEW (*)    Bacteria, UA MANY (*)    All other components within normal limits  POCT PREGNANCY, URINE - Abnormal; Notable for the following:    Preg Test, Ur POSITIVE (*)    All other components within normal limits  URINE CULTURE  BASIC METABOLIC PANEL  TYPE AND SCREEN   Imaging Review US Ob Comp Less 14 Wks  03/13/2013   CLINICAL DATA:  33 year old female with pelvic pain and bleeding. Initial encounter. Gestational age by LMP 7 weeks and 4 days.  EXAM: OBSTETRIC <14 WK [place] AND TRANSVAGINAL OB US  TECHNIQUE: Both transabdominal and transvaginal ultrasound examinations were performed for complete evaluation of the gestation as well as the maternal uterus, adnexal regions, and pelvic cul-de-sac. Transvaginal technique was performed  to assess early pregnancy.  COMPARISON:  None.  FINDINGS: Intrauterine gestational sac: Single  Yolk sac:  Not visible  Embryo:  Not visible  Cardiac Activity: Not detected  MSD:   5.8  mm   5 w   2  d  Maternal uterus/adnexae: No subchorionic hemorrhage or pelvic free fluid.  Round simple appearing left ovarian cyst measuring 20 mm, increased through transmission. The left ovary overall measures 3.6 x 2.6 x 2.4 cm. There is a more complex appearing hypoechoic area (image 43) without surrounding hypervascularity.  Normal right ovary, 3.2 x 2.2 x 2.4 cm.  IMPRESSION: Evidence of a single intrauterine gestational sac, but no yolk sac or embryo visible.  There are 2 cystic lesions of the left adnexa, but no associated hypervascularity or free fluid. Favor these are physiologic.  Recommend serial quantitative beta HCG and repeat ultrasound as necessary.   Electronically Signed   By: Lars Pinks M.D.   On: 03/13/2013 11:20   US Ob Transvaginal  03/13/2013   CLINICAL DATA:  33 year old female with pelvic pain and bleeding. Initial encounter. Gestational age by LMP 7 weeks and 4 days.  EXAM: OBSTETRIC <14 WK [place] AND TRANSVAGINAL OB US  TECHNIQUE: Both transabdominal and transvaginal ultrasound examinations were performed for complete evaluation of the gestation as well as the maternal uterus, adnexal regions, and pelvic cul-de-sac. Transvaginal technique was performed to assess early pregnancy.  COMPARISON:  None.  FINDINGS: Intrauterine gestational sac: Single  Yolk sac:  Not visible  Embryo:  Not visible  Cardiac Activity: Not detected  MSD:   5.8  mm   5 w   2  d  Maternal uterus/adnexae: No subchorionic hemorrhage or pelvic free fluid.  Round simple appearing left ovarian cyst measuring 20 mm, increased through transmission. The left ovary overall measures 3.6 x 2.6 x 2.4 cm. There is a more complex appearing hypoechoic area (image 43) without surrounding hypervascularity.  Normal right ovary, 3.2 x 2.2 x 2.4 cm.   IMPRESSION: Evidence of a single intrauterine gestational sac, but no yolk sac or embryo visible.  There are 2 cystic lesions of the left adnexa, but no associated hypervascularity or free fluid. Favor these are physiologic.  Recommend serial quantitative beta HCG and repeat ultrasound as necessary.   Electronically Signed   By: Lars Pinks M.D.   On: 03/13/2013 11:20    Patient will be referred to GYN for further evaluation and care.  I advised the patient This could be an early miscarriage and that this is important to followup.  Patient is stable upon discharge the ER.  Patient's questions were answered and Voices an understanding of the [place], PA-C 03/14/13 1606

## 2013-03-13 NOTE — Discharge Instructions (Signed)
Return here as needed. Follow up at the [facility] or your GYN in the next 2 days

## 2013-03-13 NOTE — ED Notes (Signed)
Pt is here with vaginal bleeding started this morning and states she was seen here in the last week of December and told she was pregnant.  Pt states that her last regular menstrual was in October.  Pt is unsure of exactly how far along she is.  G-5 p-4-A-0.  Pt reports lower abdominal pain

## 2013-03-14 NOTE — ED Provider Notes (Signed)
Medical screening examination/treatment/procedure(s) were performed by non-physician practitioner and as supervising physician I was immediately available for consultation/collaboration.  EKG Interpretation   None         Osvaldo Shipper, MD 03/14/13 1623

## 2013-03-15 LAB — URINE CULTURE: Colony Count: >=100000

## 2013-03-16 ENCOUNTER — Telehealth (HOSPITAL_BASED_OUTPATIENT_CLINIC_OR_DEPARTMENT_OTHER): Payer: Self-pay

## 2013-03-16 ENCOUNTER — Other Ambulatory Visit: Payer: Medicaid Other

## 2013-03-16 DIAGNOSIS — O3680X0 Pregnancy with inconclusive fetal viability, not applicable or unspecified: Secondary | ICD-10-CM

## 2013-03-16 LAB — HCG, QUANTITATIVE, PREGNANCY: hCG, Beta Chain, Quant, S: 133.1 m[IU]/mL

## 2013-03-16 NOTE — Telephone Encounter (Signed)
Post ED Visit - Positive Culture Follow-up  Culture report reviewed by antimicrobial stewardship pharmacist: [x]  Wes [place], Pharm.D., BCPS []  Heide Guile, Pharm.D., BCPS []  Alycia Rossetti, Pharm.D., BCPS []  [place], Pharm.D., BCPS, AAHIVP []  Legrand [place], Pharm.D., BCPS, AAHIVP  Positive urine culture citrobacter koseri Treated with chphalexin 500mg  , organism sensitive to the same and no further patient follow-up is required at this time.  Ileene Musa 03/16/2013, 11:02 AM

## 2013-03-17 ENCOUNTER — Telehealth: Payer: Self-pay | Admitting: General Practice

## 2013-03-17 NOTE — Telephone Encounter (Signed)
appt made for 1/29 @ 245. Called patient and informed her of results and appt and asked when she could come in next week for a lab draw. Patient verbalized understanding and stated she could come 1/22 at 2:30. Patient had no further questions

## 2013-03-17 NOTE — Telephone Encounter (Signed)
Message copied by Shelly Coss on Fri Mar 17, 2013 10:44 AM ------      Message from: Wood[place] Mode      Created: Thu Mar 16, 2013  6:08 PM       Falling quants, c/w failed pregnancy, recommend schedule clinic f/u 2 weeks repeat HCG in clinic 1 week ------

## 2013-03-23 ENCOUNTER — Other Ambulatory Visit: Payer: Medicaid Other

## 2013-03-23 DIAGNOSIS — O039 Complete or unspecified spontaneous abortion without complication: Secondary | ICD-10-CM

## 2013-03-23 LAB — HCG, QUANTITATIVE, PREGNANCY

## 2013-03-24 ENCOUNTER — Telehealth: Payer: Self-pay | Admitting: *Deleted

## 2013-03-24 NOTE — Telephone Encounter (Signed)
Pt called nurse line requesting lab results.

## 2013-03-27 NOTE — Telephone Encounter (Signed)
Called patient and discussed results. Asked patient about bleeding and cramps. Patient stated that she is no longer bleeding or having cramps. Asked patient if she would like for me to cancel her 1/29 appt and she stated that was fine. Patient had no further questions.

## 2013-03-30 ENCOUNTER — Ambulatory Visit: Payer: Self-pay | Admitting: Obstetrics & Gynecology

## 2013-04-19 ENCOUNTER — Encounter: Payer: Self-pay | Admitting: Family

## 2013-12-02 ENCOUNTER — Emergency Department (HOSPITAL_COMMUNITY)
Admission: EM | Admit: 2013-12-02 | Discharge: 2013-12-02 | Disposition: A | Discharge status: Home / Self Care | Payer: Medicaid Other | Attending: Emergency Medicine | Admitting: Emergency Medicine

## 2013-12-02 ENCOUNTER — Emergency Department (HOSPITAL_COMMUNITY): Payer: Medicaid Other

## 2013-12-02 ENCOUNTER — Encounter (HOSPITAL_COMMUNITY): Payer: Self-pay | Admitting: Emergency Medicine

## 2013-12-02 DIAGNOSIS — Z7982 Long term (current) use of aspirin: Secondary | ICD-10-CM | POA: Diagnosis not present

## 2013-12-02 DIAGNOSIS — E669 Obesity, unspecified: Secondary | ICD-10-CM | POA: Diagnosis not present

## 2013-12-02 DIAGNOSIS — R0789 Other chest pain: Secondary | ICD-10-CM | POA: Insufficient documentation

## 2013-12-02 DIAGNOSIS — R0602 Shortness of breath: Secondary | ICD-10-CM | POA: Diagnosis not present

## 2013-12-02 DIAGNOSIS — R079 Chest pain, unspecified: Secondary | ICD-10-CM | POA: Diagnosis present

## 2013-12-02 DIAGNOSIS — R110 Nausea: Secondary | ICD-10-CM | POA: Diagnosis not present

## 2013-12-02 DIAGNOSIS — Z87891 Personal history of nicotine dependence: Secondary | ICD-10-CM | POA: Diagnosis not present

## 2013-12-02 DIAGNOSIS — Z79899 Other long term (current) drug therapy: Secondary | ICD-10-CM | POA: Insufficient documentation

## 2013-12-02 LAB — CBC
HCT: 36.8 % (ref 36.0–46.0)
HEMOGLOBIN: 11.8 g/dL — AB (ref 12.0–15.0)
MCH: 28 pg (ref 26.0–34.0)
MCHC: 32.1 g/dL (ref 30.0–36.0)
MCV: 87.2 fL (ref 78.0–100.0)
Platelets: 264 10*3/uL (ref 150–400)
RBC: 4.22 MIL/uL (ref 3.87–5.11)
RDW: 13.3 % (ref 11.5–15.5)
WBC: 8 10*3/uL (ref 4.0–10.5)

## 2013-12-02 LAB — I-STAT TROPONIN, ED
Troponin i, poc: 0 ng/mL (ref 0.00–0.08)
Troponin i, poc: 0 ng/mL (ref 0.00–0.08)

## 2013-12-02 LAB — BASIC METABOLIC PANEL
Anion gap: 13 (ref 5–15)
BUN: 23 mg/dL (ref 6–23)
CALCIUM: 8.9 mg/dL (ref 8.4–10.5)
CO2: 24 mEq/L (ref 19–32)
CREATININE: 0.69 mg/dL (ref 0.50–1.10)
Chloride: 102 mEq/L (ref 96–112)
GFR calc non Af Amer: >90 mL/min (ref >90)
Glucose, Bld: 102 mg/dL — ABNORMAL HIGH (ref 70–99)
POTASSIUM: 3.2 meq/L — AB (ref 3.7–5.3)
SODIUM: 139 meq/L (ref 137–147)

## 2013-12-02 LAB — D-DIMER, QUANTITATIVE (NOT AT ARMC): D DIMER QUANT: 0.28 ug{FEU}/mL (ref 0.00–0.48)

## 2013-12-02 MED ORDER — METHOCARBAMOL 500 MG PO TABS: 1000.0000 mg | ORAL_TABLET | Freq: Four times a day (QID) | ORAL | Status: DC | PRN

## 2013-12-02 MED ORDER — POTASSIUM CHLORIDE CRYS ER 20 MEQ PO TBCR
40.0000 meq | EXTENDED_RELEASE_TABLET | Freq: Once | ORAL | Status: AC
  Administered 2013-12-02: 40 meq via ORAL
  Filled 2013-12-02: qty 2

## 2013-12-02 MED ORDER — SODIUM CHLORIDE 0.9 % IV BOLUS (SEPSIS)
1000.0000 mL | Freq: Once | INTRAVENOUS | Status: AC
  Administered 2013-12-02: 1000 mL via INTRAVENOUS

## 2013-12-02 MED ORDER — METHOCARBAMOL 500 MG PO TABS
1000.0000 mg | ORAL_TABLET | Freq: Once | ORAL | Status: AC
  Administered 2013-12-02: 1000 mg via ORAL
  Filled 2013-12-02: qty 2

## 2013-12-02 MED ORDER — ASPIRIN 81 MG PO CHEW
324.0000 mg | CHEWABLE_TABLET | Freq: Once | ORAL | Status: AC
  Administered 2013-12-02: 324 mg via ORAL
  Filled 2013-12-02: qty 4

## 2013-12-02 MED ORDER — ASPIRIN 81 MG PO CHEW: 81.0000 mg | CHEWABLE_TABLET | Freq: Every day | ORAL | Status: DC

## 2013-12-02 MED ORDER — KETOROLAC TROMETHAMINE 30 MG/ML IJ SOLN
30.0000 mg | Freq: Once | INTRAMUSCULAR | Status: AC
  Administered 2013-12-02: 30 mg via INTRAVENOUS
  Filled 2013-12-02: qty 1

## 2013-12-02 NOTE — Discharge Instructions (Signed)
For pain control you may take up to 800mg  of Motrin (also known as ibuprofen). That is usually 4 over the counter pills,  3 times a day. Take with food to minimize stomach irritation   You can also take  tylenol (acetaminophen) 975mg  (this is 3 over the counter pills) four times a day. Do not drink alcohol or combine with other medications that have acetaminophen as an ingredient (Read the labels!).    For breakthrough pain you may take Robaxin. Do not drink alcohol, drive or operate heavy machinery when taking Robaxin.  Please follow with your primary care doctor in the next 2 days for a check-up. They must obtain records for further management.   Do not hesitate to return to the Emergency Department for any new, worsening or concerning symptoms.    Chest Pain (Nonspecific) It is often hard to give a specific diagnosis for the cause of chest pain. There is always a chance that your pain could be related to something serious, such as a heart attack or a blood clot in the lungs. You need to follow up with your health care provider for further evaluation. CAUSES   Heartburn.  Pneumonia or bronchitis.  Anxiety or stress.  Inflammation around your heart (pericarditis) or lung (pleuritis or pleurisy).  A blood clot in the lung.  A collapsed lung (pneumothorax). It can develop suddenly on its own (spontaneous pneumothorax) or from trauma to the chest.  Shingles infection (herpes zoster virus). The chest wall is composed of bones, muscles, and cartilage. Any of these can be the source of the pain.  The bones can be bruised by injury.  The muscles or cartilage can be strained by coughing or overwork.  The cartilage can be affected by inflammation and become sore (costochondritis). DIAGNOSIS  Lab tests or other studies may be needed to find the cause of your pain. Your health care provider may have you take a test called an ambulatory electrocardiogram (ECG). An ECG records your heartbeat  patterns over a 24-hour period. You may also have other tests, such as:  Transthoracic echocardiogram (TTE). During echocardiography, sound waves are used to evaluate how blood flows through your heart.  Transesophageal echocardiogram (TEE).  Cardiac monitoring. This allows your health care provider to monitor your heart rate and rhythm in real time.  Holter monitor. This is a portable device that records your heartbeat and can help diagnose heart arrhythmias. It allows your health care provider to track your heart activity for several days, if needed.  Stress tests by exercise or by giving medicine that makes the heart beat faster. TREATMENT   Treatment depends on what may be causing your chest pain. Treatment may include:  Acid blockers for heartburn.  Anti-inflammatory medicine.  Pain medicine for inflammatory conditions.  Antibiotics if an infection is present.  You may be advised to change lifestyle habits. This includes stopping smoking and avoiding alcohol, caffeine, and chocolate.  You may be advised to keep your head raised (elevated) when sleeping. This reduces the chance of acid going backward from your stomach into your esophagus. Most of the time, nonspecific chest pain will improve within 2-3 days with rest and mild pain medicine.  HOME CARE INSTRUCTIONS   If antibiotics were prescribed, take them as directed. Finish them even if you start to feel better.  For the next few days, avoid physical activities that bring on chest pain. Continue physical activities as directed.  Do not use any tobacco products, including cigarettes, chewing tobacco,  or electronic cigarettes.  Avoid drinking alcohol.  Only take medicine as directed by your health care provider.  Follow your health care provider's suggestions for further testing if your chest pain does not go away.  Keep any follow-up appointments you made. If you do not go to an appointment, you could develop lasting  (chronic) problems with pain. If there is any problem keeping an appointment, call to reschedule. SEEK MEDICAL CARE IF:   Your chest pain does not go away, even after treatment.  You have a rash with blisters on your chest.  You have a fever. SEEK IMMEDIATE MEDICAL CARE IF:   You have increased chest pain or pain that spreads to your arm, neck, jaw, back, or abdomen.  You have shortness of breath.  You have an increasing cough, or you cough up blood.  You have severe back or abdominal pain.  You feel nauseous or vomit.  You have severe weakness.  You faint.  You have chills. This is an emergency. Do not wait to see if the pain will go away. Get medical help at once. Call your local emergency services (911 in [place] Do not drive yourself to the hospital. MAKE SURE YOU:   Understand these instructions.  Will watch your condition.  Will get help right away if you are not doing well or get worse. Document Released: 11/26/2004 Document Revised: 02/21/2013 Document Reviewed: 09/22/2007 [facility] Patient Information 2015 [place], [place]. This information is not intended to replace advice given to you by your health care provider. Make sure you discuss any questions you have with your health care provider.

## 2013-12-02 NOTE — ED Notes (Signed)
Pt resting, on cell phone; no needs at this time

## 2013-12-02 NOTE — ED Notes (Signed)
The pt is c/o posterior chest pain and lt arm numbness and it feels worse.  The arm discomfort is worse with movement.  lmp now.  No sob

## 2013-12-02 NOTE — ED Provider Notes (Signed)
CSN: [id number]     Arrival date & time 12/02/13  1638 History   First MD Initiated Contact with Patient 12/02/13 [phone]     Chief Complaint  Patient presents with  . Chest Pain     (Consider location/radiation/quality/duration/timing/severity/associated sxs/prior Treatment) HPI  Holly Whitehead is a 33 y.o. female with no significant past medical history complaining of left upper back pain onset 4 days ago, pain is rated at 5/10, she cannot describe the quality, it radiates to the anterior left chest, the left neck and has a left arm pins and needles paresthesia. Pain is becoming more severe and wakes her from sleep. Pain is exacerbated by Position and deep breaths. States she took a Ambulance person several days ago and it did not alleviate the pain. Associated symptoms of shortness of breath and nausea. Patient denies fever, chills, cough history of DVT or PE, exogenous estrogen, recent immobilization. Patient states that she has bilateral leg swelling that lasted for 2 weeks and resolved 2 weeks ago. Patient states that she lifts regularly for work but that this does not feel like a muscle strain. She denies history of diabetes, hypertension, hyperlipidemia, patient is a former smoker. She has a family history of CAD she states that her sister had an MI at 74. She does not know the details, if the sister had any stent put in.    History reviewed. No pertinent past medical history. History reviewed. No pertinent past surgical history. No family history on file. History  Substance Use Topics  . Smoking status: Former Research scientist (life sciences)  . Smokeless tobacco: Not on file  . Alcohol Use: Yes   OB History   Grav Para Term Preterm Abortions TAB SAB Ect Mult Living                 Review of Systems  10 systems reviewed and found to be negative, except as noted in the HPI.   Allergies  Bee venom and Adhesive  Home Medications   Prior to Admission medications   Medication Sig Start Date End Date Taking?  Authorizing Provider  acetaminophen (TYLENOL) 500 MG tablet Take 1,000 mg by mouth every 6 (six) hours as needed. For headache   Yes Historical Provider, MD  Biotin 10 MG TABS Take 10 mg by mouth daily.   Yes Historical Provider, MD  bismuth subsalicylate (PEPTO BISMOL) 262 MG/15ML suspension Take 30 mLs by mouth every 6 (six) hours as needed for indigestion.   Yes Historical Provider, MD  hydrocortisone cream 1 % Apply 1 application topically 2 (two) times daily as needed for itching.   Yes Historical Provider, MD  Multiple Vitamins-Minerals (MULTIVITAMIN PO) Take 1 tablet by mouth daily.   Yes Historical Provider, MD  oxyCODONE-acetaminophen (PERCOCET/ROXICET) 5-325 MG per tablet Take 0.5-1 tablets by mouth every 4 (four) hours as needed for severe pain (or migraines).   Yes Historical Provider, MD  ranitidine (ZANTAC) 150 MG tablet Take 150 mg by mouth daily as needed for heartburn.   Yes Historical Provider, MD  vitamin E 400 UNIT capsule Take 400 Units by mouth daily.   Yes Historical Provider, MD  aspirin 81 MG chewable tablet Chew 1 tablet (81 mg total) by mouth daily. 12/02/13   Alianny Toelle, PA-C  methocarbamol (ROBAXIN) 500 MG tablet Take 2 tablets (1,000 mg total) by mouth 4 (four) times daily as needed (Pain). 12/02/13   Yuki Brunsman, PA-C   BP 110/71  Pulse 78  Temp(Src) 98 F (36.7 C) (Oral)  Resp  17  SpO2 100%  LMP 12/02/2013 Physical Exam  Nursing note and vitals reviewed. Constitutional: She is oriented to person, place, and time. She appears well-developed and well-nourished. No distress.  Obese  HENT:  Head: Normocephalic and atraumatic.  Mouth/Throat: Oropharynx is clear and moist.  Eyes: Conjunctivae and EOM are normal.  Neck: Normal range of motion. No JVD present.  Cardiovascular: Normal rate, regular rhythm and intact distal pulses.   Radial pulse 2+ bilaterally, no asymmetry.  Pulmonary/Chest: Effort normal and breath sounds normal. No stridor. No  respiratory distress. She has no wheezes. She has no rales. She exhibits tenderness.  Patient is exquisitely tender to palpation on the left scapular and left upper chest area however, she states that this is not the pain that she is describing, the pain is not reproducible.  Abdominal: Soft. Bowel sounds are normal. She exhibits no distension and no mass. There is no tenderness. There is no rebound and no guarding.  Musculoskeletal: Normal range of motion. She exhibits tenderness. She exhibits no edema.  No calf asymmetry, erythema, palpable cords, superficial collaterals. Patient is tender to palpation on the right calf.  Neurological: She is alert and oriented to person, place, and time.  Psychiatric: She has a normal mood and affect.    ED Course  Procedures (including critical care time) Labs Review Labs Reviewed  CBC - Abnormal; Notable for the following:    Hemoglobin 11.8 (*)    All other components within normal limits  BASIC METABOLIC PANEL - Abnormal; Notable for the following:    Potassium 3.2 (*)    Glucose, Bld 102 (*)    All other components within normal limits  D-DIMER, QUANTITATIVE  I-STAT TROPOININ, ED  I-STAT TROPOININ, ED    Imaging Review Dg Chest 2 View  12/02/2013   CLINICAL DATA:  Left-sided chest pain, upper left back pain, left arm pain.  EXAM: CHEST  2 VIEW  COMPARISON:  04/23/2009  FINDINGS: The heart size and mediastinal contours are within normal limits. Both lungs are clear. The visualized skeletal structures are unremarkable.  IMPRESSION: No active cardiopulmonary disease.   Electronically Signed   By: Lucienne Capers M.D.   On: 12/02/2013 04:59     EKG Interpretation   Date/Time:  Saturday December 02 2013 09:31:50 EDT Ventricular Rate:  83 PR Interval:  195 QRS Duration: 74 QT Interval:  383 QTC Calculation: 450 R Axis:   73 Text Interpretation:  Sinus rhythm Low voltage, precordial leads  Borderline T abnormalities, anterior leads No  significant change since  last tracing Confirmed by GOLDSTON  MD, SCOTT ([id number]) on 12/02/2013 9:34:40  AM      MDM   Final diagnoses:  Atypical chest pain    Filed Vitals:   12/02/13 0730 12/02/13 0736 12/02/13 0800 12/02/13 0830  BP: 110/78 113/60 109/58 110/71  Pulse: 89 93 90 78  Temp:      TempSrc:      Resp: 32 14 16 17   SpO2: 98% 100% 100% 100%    Medications  aspirin chewable tablet 324 mg (324 mg Oral Given 12/02/13 0635)  methocarbamol (ROBAXIN) tablet 1,000 mg (1,000 mg Oral Given 12/02/13 0635)  potassium chloride SA (K-DUR,KLOR-CON) CR tablet 40 mEq (40 mEq Oral Given 12/02/13 0635)  sodium chloride 0.9 % bolus 1,000 mL (0 mLs Intravenous Stopped 12/02/13 0849)  ketorolac (TORADOL) 30 MG/ML injection 30 mg (30 mg Intravenous Given 12/02/13 2774)    Holly Whitehead is a 33 y.o. female presenting with  pleuritic and positional chest pain that radiates to the jaw and arm. As per patient she has a family history of early CAD. Risk factors of former smoker and obesity in addition to family history. Patient also has left calf tenderness palpation, she feels short of breath and is mildly tachycardic. Plan is cardiac workup, d-dimer and delta troponin. Patient will be given aspirin, Toradol and Robaxin.  Delta troponin is negative and patient's d-dimer is also within normal limits. Patient has a heart score of 3 which is low risk. I discussed the case with attending physician Dr. Regenia Skeeter who agrees with care plan and stability to discharge to home with cardiology follow up.  Evaluation does not show pathology that would require ongoing emergent intervention or inpatient treatment. Pt is hemodynamically stable and mentating appropriately. Discussed findings and plan with patient/guardian, who agrees with care plan. All questions answered. Return precautions discussed and outpatient follow up given.   New Prescriptions   ASPIRIN 81 MG CHEWABLE TABLET    Chew 1 tablet (81 mg total) by  mouth daily.   METHOCARBAMOL (ROBAXIN) 500 MG TABLET    Take 2 tablets (1,000 mg total) by mouth 4 (four) times daily as needed (Pain).         Monico Blitz, PA-C 12/02/13 [phone]

## 2013-12-03 NOTE — ED Provider Notes (Signed)
Medical screening examination/treatment/procedure(s) were performed by non-physician practitioner and as supervising physician I was immediately available for consultation/collaboration.   EKG Interpretation   Date/Time:  Saturday December 02 2013 09:31:50 EDT Ventricular Rate:  83 PR Interval:  195 QRS Duration: 74 QT Interval:  383 QTC Calculation: 450 R Axis:   73 Text Interpretation:  Sinus rhythm Low voltage, precordial leads  Borderline T abnormalities, anterior leads No significant change since  last tracing Confirmed by Gizelle Whetsel  MD, Chaka Jefferys ([id number]) on 12/02/2013 9:34:40  AM        Ephraim Hamburger, MD 12/03/13 [phone]

## 2014-03-02 NOTE — L&D Delivery Note (Signed)
Delivery Note At 10:31 PM a viable and healthy female was delivered via Vaginal, Spontaneous Delivery (Presentation: Right Occiput Anterior).  Pt had 10 sec MILD shoulder dystocia relieved with Deirdre Evener.  Pt pushed 2-3x for delivery.  APGAR: 9, 9; weight  .   Placenta status: Intact, Spontaneous.  Cord: 2 vessels with the following complications: None.   Anesthesia: Epidural  Episiotomy: None Lacerations:  R labial Suture Repair: 3.0 vicryl rapide Est. Blood Loss (mL):  243cc  Mom to postpartum.  Baby to Couplet care / Skin to Skin.  Bovard-Stuckert, Jahaira Earnhart 11/08/2014, 10:51 PM  Pt declines circumcision  Br/Bo; Depo at d/c sign BTL papers - BTL in PP care; RI, Tdap in [facility]

## 2014-06-06 ENCOUNTER — Encounter: Payer: Self-pay | Admitting: Advanced Practice Midwife

## 2014-06-06 ENCOUNTER — Encounter: Payer: Medicaid Other | Admitting: Physician Assistant

## 2014-06-19 LAB — OB RESULTS CONSOLE ABO/RH: RH Type: POSITIVE

## 2014-06-19 LAB — OB RESULTS CONSOLE RPR: RPR: NONREACTIVE

## 2014-06-19 LAB — OB RESULTS CONSOLE HIV ANTIBODY (ROUTINE TESTING): HIV: NONREACTIVE

## 2014-06-19 LAB — OB RESULTS CONSOLE GC/CHLAMYDIA
Chlamydia: NEGATIVE
Gonorrhea: NEGATIVE

## 2014-06-20 ENCOUNTER — Encounter: Payer: Self-pay | Admitting: General Practice

## 2014-10-14 LAB — OB RESULTS CONSOLE GBS: STREP GROUP B AG: NEGATIVE

## 2014-11-08 ENCOUNTER — Inpatient Hospital Stay (HOSPITAL_COMMUNITY): Payer: Medicaid Other | Admitting: Anesthesiology

## 2014-11-08 ENCOUNTER — Inpatient Hospital Stay (HOSPITAL_COMMUNITY)
Admission: AD | Admit: 2014-11-08 | Discharge: 2014-11-10 | DRG: 775 | Disposition: A | Discharge status: Home / Self Care | Payer: Medicaid Other | Source: Ambulatory Visit | Attending: Obstetrics and Gynecology | Admitting: Obstetrics and Gynecology

## 2014-11-08 ENCOUNTER — Encounter (HOSPITAL_COMMUNITY): Payer: Self-pay | Admitting: *Deleted

## 2014-11-08 DIAGNOSIS — D62 Acute posthemorrhagic anemia: Secondary | ICD-10-CM

## 2014-11-08 DIAGNOSIS — Z3490 Encounter for supervision of normal pregnancy, unspecified, unspecified trimester: Secondary | ICD-10-CM

## 2014-11-08 DIAGNOSIS — Z3A40 40 weeks gestation of pregnancy: Secondary | ICD-10-CM | POA: Diagnosis present

## 2014-11-08 DIAGNOSIS — Z87891 Personal history of nicotine dependence: Secondary | ICD-10-CM

## 2014-11-08 DIAGNOSIS — Q270 Congenital absence and hypoplasia of umbilical artery: Secondary | ICD-10-CM

## 2014-11-08 DIAGNOSIS — D509 Iron deficiency anemia, unspecified: Secondary | ICD-10-CM

## 2014-11-08 HISTORY — DX: Congenital absence and hypoplasia of umbilical artery: Q27.0

## 2014-11-08 LAB — CBC
HCT: 30.4 % — ABNORMAL LOW (ref 36.0–46.0)
Hemoglobin: 9.9 g/dL — ABNORMAL LOW (ref 12.0–15.0)
MCH: 30.1 pg (ref 26.0–34.0)
MCHC: 32.6 g/dL (ref 30.0–36.0)
MCV: 92.4 fL (ref 78.0–100.0)
PLATELETS: 269 10*3/uL (ref 150–400)
RBC: 3.29 MIL/uL — ABNORMAL LOW (ref 3.87–5.11)
RDW: 14 % (ref 11.5–15.5)
WBC: 9.2 10*3/uL (ref 4.0–10.5)

## 2014-11-08 LAB — TYPE AND SCREEN
ABO/RH(D): A POS
ANTIBODY SCREEN: NEGATIVE

## 2014-11-08 MED ORDER — FLEET ENEMA 7-19 GM/118ML RE ENEM: 1.0000 | ENEMA | RECTAL | Status: DC | PRN

## 2014-11-08 MED ORDER — OXYTOCIN 40 UNITS IN LACTATED RINGERS INFUSION - SIMPLE MED
62.5000 mL/h | INTRAVENOUS | Status: DC
  Filled 2014-11-08: qty 1000

## 2014-11-08 MED ORDER — PHENYLEPHRINE 40 MCG/ML (10ML) SYRINGE FOR IV PUSH (FOR BLOOD PRESSURE SUPPORT)
80.0000 ug | PREFILLED_SYRINGE | INTRAVENOUS | Status: DC | PRN
  Filled 2014-11-08: qty 20
  Filled 2014-11-08: qty 2

## 2014-11-08 MED ORDER — OXYTOCIN BOLUS FROM INFUSION: 500.0000 mL | INTRAVENOUS | Status: DC

## 2014-11-08 MED ORDER — DIPHENHYDRAMINE HCL 50 MG/ML IJ SOLN
12.5000 mg | INTRAMUSCULAR | Status: DC | PRN
Start: 2014-11-08 — End: 2014-11-10
  Administered 2014-11-08: 12.5 mg via INTRAVENOUS
  Filled 2014-11-08: qty 1

## 2014-11-08 MED ORDER — OXYCODONE-ACETAMINOPHEN 5-325 MG PO TABS: 2.0000 | ORAL_TABLET | ORAL | Status: DC | PRN

## 2014-11-08 MED ORDER — LACTATED RINGERS IV SOLN
500.0000 mL | INTRAVENOUS | Status: DC | PRN
  Administered 2014-11-08: 250 mL via INTRAVENOUS

## 2014-11-08 MED ORDER — OXYTOCIN 40 UNITS IN LACTATED RINGERS INFUSION - SIMPLE MED
1.0000 m[IU]/min | INTRAVENOUS | Status: DC
  Administered 2014-11-08: 2 m[IU]/min via INTRAVENOUS

## 2014-11-08 MED ORDER — CITRIC ACID-SODIUM CITRATE 334-500 MG/5ML PO SOLN: 30.0000 mL | ORAL | Status: DC | PRN

## 2014-11-08 MED ORDER — ONDANSETRON HCL 4 MG/2ML IJ SOLN: 4.0000 mg | Freq: Four times a day (QID) | INTRAMUSCULAR | Status: DC | PRN

## 2014-11-08 MED ORDER — EPHEDRINE 5 MG/ML INJ
10.0000 mg | INTRAVENOUS | Status: DC | PRN
  Filled 2014-11-08: qty 2

## 2014-11-08 MED ORDER — LIDOCAINE HCL (PF) 1 % IJ SOLN
30.0000 mL | INTRAMUSCULAR | Status: DC | PRN
  Filled 2014-11-08: qty 30

## 2014-11-08 MED ORDER — LACTATED RINGERS IV SOLN
INTRAVENOUS | Status: DC
  Administered 2014-11-08 (×2): via INTRAVENOUS

## 2014-11-08 MED ORDER — LIDOCAINE HCL (PF) 1 % IJ SOLN
INTRAMUSCULAR | Status: DC | PRN
  Administered 2014-11-08: 4 mL
  Administered 2014-11-08: 6 mL via EPIDURAL

## 2014-11-08 MED ORDER — ACETAMINOPHEN 325 MG PO TABS: 650.0000 mg | ORAL_TABLET | ORAL | Status: DC | PRN

## 2014-11-08 MED ORDER — FENTANYL 2.5 MCG/ML BUPIVACAINE 1/10 % EPIDURAL INFUSION (WH - ANES): 14.0000 mL/h | INTRAMUSCULAR | Status: DC | PRN

## 2014-11-08 MED ORDER — OXYCODONE-ACETAMINOPHEN 5-325 MG PO TABS: 1.0000 | ORAL_TABLET | ORAL | Status: DC | PRN

## 2014-11-08 MED ORDER — TERBUTALINE SULFATE 1 MG/ML IJ SOLN
0.2500 mg | Freq: Once | INTRAMUSCULAR | Status: DC | PRN
  Filled 2014-11-08: qty 1

## 2014-11-08 MED ORDER — FENTANYL 2.5 MCG/ML BUPIVACAINE 1/10 % EPIDURAL INFUSION (WH - ANES)
14.0000 mL/h | INTRAMUSCULAR | Status: DC | PRN
  Administered 2014-11-08: 14 mL/h via EPIDURAL
  Filled 2014-11-08: qty 125

## 2014-11-08 NOTE — Anesthesia Procedure Notes (Signed)
Epidural Patient location during procedure: OB  Preanesthetic Checklist Completed: patient identified, site marked, surgical consent, pre-op evaluation, timeout performed, IV checked, risks and benefits discussed and monitors and equipment checked  Epidural Patient position: sitting Prep: site prepped and draped and DuraPrep Patient monitoring: continuous pulse ox and blood pressure Approach: midline Location: L3-L4 Injection technique: LOR air  Needle:  Needle type: Tuohy  Needle gauge: 17 G Needle length: 9 cm and 9 Needle insertion depth: 7 cm Catheter type: closed end flexible Catheter size: 19 Gauge Catheter at skin depth: 15 cm Test dose: negative  Assessment Events: blood not aspirated, injection not painful, no injection resistance, negative IV test and no paresthesia  Additional Notes Dosing of Epidural:  1st dose, through catheter ............................................Marland Kitchen  Xylocaine 40 mg  2nd dose, through catheter, after waiting 3 minutes........Marland KitchenXylocaine 60 mg    As each dose occurred, patient was free of IV sx; and patient exhibited no evidence of SA injection.  Patient is more comfortable after epidural dosed. Please see RN's note for documentation of vital signs,and FHR which are stable.  Patient reminded not to try to ambulate with numb legs, and that an RN must be present when she attempts to get up.

## 2014-11-08 NOTE — Progress Notes (Signed)
Patient ID: Holly Whitehead, female   DOB: [birth year], 34 y.o.   MRN: [id number]  Comfortable with epidural  AFVSS gen NAD FHTs 135-140's, R, gooc var, category 1 toco irr  AROM for copious clear fluid, w/o diff/comp  SVE 4.5/90/-1  Continue IOL

## 2014-11-08 NOTE — Anesthesia Preprocedure Evaluation (Addendum)
Anesthesia Evaluation  Patient identified by MRN, date of birth, ID band Patient awake    Reviewed: Allergy & Precautions, H&P , Patient's Chart, lab work & pertinent test results  Airway Mallampati: IV  TM Distance: >3 FB Neck ROM: full    Dental  (+) Teeth Intact   Pulmonary former smoker,    breath sounds clear to auscultation       Cardiovascular  Rhythm:regular Rate:Normal     Neuro/Psych    GI/Hepatic   Endo/Other    Renal/GU      Musculoskeletal   Abdominal   Peds  Hematology  (+) anemia ,   Anesthesia Other Findings       Reproductive/Obstetrics (+) Pregnancy                            Anesthesia Physical Anesthesia Plan  ASA: II  Anesthesia Plan: Epidural   Post-op Pain Management:    Induction:   Airway Management Planned:   Additional Equipment:   Intra-op Plan:   Post-operative Plan:   Informed Consent: I have reviewed the patients History and Physical, chart, labs and discussed the procedure including the risks, benefits and alternatives for the proposed anesthesia with the patient or authorized representative who has indicated his/her understanding and acceptance.   Dental Advisory Given  Plan Discussed with:   Anesthesia Plan Comments: (Labs checked- platelets confirmed with RN in room. Fetal heart tracing, per RN, reported to be stable enough for sitting procedure. Discussed epidural, and patient consents to the procedure:  included risk of possible headache,backache, failed block, allergic reaction, and nerve injury. This patient was asked if she had any questions or concerns before the procedure started.)        Anesthesia Quick Evaluation

## 2014-11-08 NOTE — H&P (Signed)
Holly Whitehead is a 34 y.o. female [phone] at 38+ with SUA, followed with [facility] NST.  Pt late to [facility].  +FM, no LOF, no VB, occ ctx.  D/W pt IOL and process.     Maternal Medical History:  Contractions: Frequency: irregular.   Perceived severity is moderate.    Fetal activity: Perceived fetal activity is normal.    Prenatal complications: no prenatal complications Prenatal Complications - Diabetes: none.    OB History    Gravida Para Term Preterm AB TAB SAB Ectopic Multiple Living   4 2 2  0 1 0 1 0 0 2    G1 & G2 39wk SVD x 2 G3 SAB G4 present  No abn pap No STD  Past Medical History  Diagnosis Date  . Single artery and vein of umbilical cord affecting care of newborn 11/08/2014  hemorrhoids Back pain  Past Surgical History  Procedure Laterality Date  . No past surgeries     Family History: Breast cancer, leukemia, 1/2 sister - uterine cancer  Social History:  reports that she has quit smoking. Her smoking use included Cigarettes. She smoked 1.00 pack per day. She quit smokeless tobacco use about 8 years ago. She reports that she drinks alcohol. She reports that she does not use illicit drugs.married.  Meds flexeril, protonix, PNV, anusol All NKDA   Prenatal Transfer Tool  Maternal Diabetes: No Genetic Screening: Normal Maternal Ultrasounds/Referrals: Normal Fetal Ultrasounds or other Referrals:  None Maternal Substance Abuse:  Yes:  Type: Smoker Significant Maternal Medications:  None Significant Maternal Lab Results:  Lab values include: Group B Strep negative Other Comments:  None  Review of Systems  Constitutional: Negative.   HENT: Negative.   Eyes: Negative.   Respiratory: Negative.   Cardiovascular: Negative.   Gastrointestinal: Negative.   Genitourinary: Negative.   Musculoskeletal: Negative.   Skin: Negative.   Neurological: Negative.   Psychiatric/Behavioral: Negative.       Blood pressure 128/72, pulse 107, temperature 98.3 F (36.8 C),  temperature source Oral, resp. rate 20, height 5\' 4"  (1.626 m), weight 110.678 kg (244 lb), last menstrual period 01/29/2014. Maternal Exam:  Uterine Assessment: Contraction frequency is irregular.   Abdomen: Patient reports no abdominal tenderness. Fundal height is appropriate for gestation.   Estimated fetal weight is 8-9#.   Fetal presentation: vertex  Introitus: Normal vulva. Normal vagina.    Physical Exam  Constitutional: She is oriented to person, place, and time. She appears well-developed and well-nourished.  HENT:  Head: Normocephalic and atraumatic.  Cardiovascular: Normal rate and regular rhythm.   Respiratory: Effort normal and breath sounds normal. No respiratory distress. She has no wheezes.  GI: Soft. Bowel sounds are normal. She exhibits no distension. There is no tenderness.  Musculoskeletal: Normal range of motion.  Neurological: She is alert and oriented to person, place, and time.  Skin: Skin is warm and dry.  Psychiatric: She has a normal mood and affect. Her behavior is normal.    Prenatal labs: ABO, Rh: A/Positive/-- (04/19 0000) Antibody:  screen Rubella:  immune RPR: Nonreactive (04/19 0000)  HBsAg:   neg HIV: Non-reactive (04/19 0000)  GBS: Negative (08/14 0000)   Hgb 10.3/Plt 311K/UrCx neg/ GC neg/ Chl neg/ AFP WNL/glucola 122/  Nl anat, 2 VC, post plac  Tdap 08/08/14  Assessment/Plan: 34yo [id number] at 40+ with SUA for IOL AROM and pitocin Expect SVD   Bovard-Stuckert, Liliah Dorian 11/08/2014, 4:41 PM

## 2014-11-08 NOTE — Progress Notes (Signed)
Delivery of live viable female by Dr. Melba Coon, APGARS 9,9

## 2014-11-09 ENCOUNTER — Encounter (HOSPITAL_COMMUNITY): Payer: Self-pay

## 2014-11-09 LAB — CBC
HEMATOCRIT: 25.5 % — AB (ref 36.0–46.0)
Hemoglobin: 8.5 g/dL — ABNORMAL LOW (ref 12.0–15.0)
MCH: 30.7 pg (ref 26.0–34.0)
MCHC: 33.3 g/dL (ref 30.0–36.0)
MCV: 92.1 fL (ref 78.0–100.0)
PLATELETS: 203 10*3/uL (ref 150–400)
RBC: 2.77 MIL/uL — AB (ref 3.87–5.11)
RDW: 13.9 % (ref 11.5–15.5)
WBC: 8.9 10*3/uL (ref 4.0–10.5)

## 2014-11-09 LAB — RPR: RPR Ser Ql: NONREACTIVE

## 2014-11-09 MED ORDER — SENNOSIDES-DOCUSATE SODIUM 8.6-50 MG PO TABS
2.0000 | ORAL_TABLET | ORAL | Status: DC
  Administered 2014-11-09 – 2014-11-10 (×2): 2 via ORAL
  Filled 2014-11-09 (×2): qty 2

## 2014-11-09 MED ORDER — PRENATAL MULTIVITAMIN CH
1.0000 | ORAL_TABLET | Freq: Every day | ORAL | Status: DC
  Administered 2014-11-09 – 2014-11-10 (×2): 1 via ORAL
  Filled 2014-11-09 (×2): qty 1

## 2014-11-09 MED ORDER — WITCH HAZEL-GLYCERIN EX PADS: 1.0000 "application " | MEDICATED_PAD | CUTANEOUS | Status: DC | PRN

## 2014-11-09 MED ORDER — ONDANSETRON HCL 4 MG PO TABS: 4.0000 mg | ORAL_TABLET | ORAL | Status: DC | PRN

## 2014-11-09 MED ORDER — PANTOPRAZOLE SODIUM 20 MG PO TBEC
20.0000 mg | DELAYED_RELEASE_TABLET | Freq: Every day | ORAL | Status: DC
  Administered 2014-11-09 – 2014-11-10 (×2): 20 mg via ORAL
  Filled 2014-11-09 (×2): qty 1

## 2014-11-09 MED ORDER — DIBUCAINE 1 % RE OINT: 1.0000 "application " | TOPICAL_OINTMENT | RECTAL | Status: DC | PRN

## 2014-11-09 MED ORDER — ZOLPIDEM TARTRATE 5 MG PO TABS: 5.0000 mg | ORAL_TABLET | Freq: Every evening | ORAL | Status: DC | PRN

## 2014-11-09 MED ORDER — DIPHENHYDRAMINE HCL 25 MG PO CAPS: 25.0000 mg | ORAL_CAPSULE | Freq: Four times a day (QID) | ORAL | Status: DC | PRN

## 2014-11-09 MED ORDER — IBUPROFEN 600 MG PO TABS
600.0000 mg | ORAL_TABLET | Freq: Four times a day (QID) | ORAL | Status: DC
  Administered 2014-11-09 – 2014-11-10 (×7): 600 mg via ORAL
  Filled 2014-11-09 (×7): qty 1

## 2014-11-09 MED ORDER — OXYCODONE-ACETAMINOPHEN 5-325 MG PO TABS
1.0000 | ORAL_TABLET | ORAL | Status: DC | PRN
  Administered 2014-11-09 (×5): 1 via ORAL
  Filled 2014-11-09 (×5): qty 1

## 2014-11-09 MED ORDER — OXYCODONE-ACETAMINOPHEN 5-325 MG PO TABS
2.0000 | ORAL_TABLET | ORAL | Status: DC | PRN
Start: 2014-11-09 — End: 2014-11-10
  Administered 2014-11-09 – 2014-11-10 (×4): 2 via ORAL
  Filled 2014-11-09 (×4): qty 2

## 2014-11-09 MED ORDER — ACETAMINOPHEN 325 MG PO TABS: 650.0000 mg | ORAL_TABLET | ORAL | Status: DC | PRN

## 2014-11-09 MED ORDER — MEDROXYPROGESTERONE ACETATE 150 MG/ML IM SUSP
150.0000 mg | INTRAMUSCULAR | Status: AC | PRN
  Administered 2014-11-10: 150 mg via INTRAMUSCULAR
  Filled 2014-11-09: qty 1

## 2014-11-09 MED ORDER — SIMETHICONE 80 MG PO CHEW: 80.0000 mg | CHEWABLE_TABLET | ORAL | Status: DC | PRN

## 2014-11-09 MED ORDER — BENZOCAINE-MENTHOL 20-0.5 % EX AERO
1.0000 "application " | INHALATION_SPRAY | CUTANEOUS | Status: DC | PRN
  Administered 2014-11-09: 1 via TOPICAL
  Filled 2014-11-09: qty 56

## 2014-11-09 MED ORDER — LANOLIN HYDROUS EX OINT: TOPICAL_OINTMENT | CUTANEOUS | Status: DC | PRN

## 2014-11-09 MED ORDER — ONDANSETRON HCL 4 MG/2ML IJ SOLN: 4.0000 mg | INTRAMUSCULAR | Status: DC | PRN

## 2014-11-09 MED ORDER — LACTATED RINGERS IV SOLN: INTRAVENOUS | Status: DC

## 2014-11-09 NOTE — Lactation Note (Signed)
This note was copied from the chart of [place]. Lactation Consultation Note Initial visit at 63 hours of age.  Mom reports just finishing a bottle of 9 mls and breast feeding before that for 30 minutes.  Mom reports she is only planning to breastfeed in the hospital due to her work schedule baby will have to have formula she will not be able to continue breastfeeding.  Mom not receptive to exploring options for breastfeeding longer.  [facility] LC resources given and discussed.  Encouraged to feed with early cues on demand.  Early newborn behavior discussed.  Hand expression demonstrated with colostrum visible. Breasts appear cone shaped with bulbous nipples.   Mom to call for assist as needed.     Patient Name: Holly Whitehead [id number] Date: 04/08/621 Reason for consult: Initial assessment   Maternal Data Has patient been taught Hand Expression?: Yes Does the patient have breastfeeding experience prior to this delivery?: Yes  Feeding Feeding Type: Breast Fed Length of feed: 25 min  LATCH Score/Interventions                      Lactation Tools Discussed/Used WIC Program: Yes   Consult Status Consult Status: PRN    Justice Britain 11/09/2014, 5:23 PM

## 2014-11-09 NOTE — Progress Notes (Signed)
UR chart review completed.  

## 2014-11-09 NOTE — Progress Notes (Signed)
MOB was referred for history of depression/anxiety.  Referral is screened out by Clinical Social Worker because none of the following criteria appear to apply: -History of anxiety/depression during this pregnancy, or of post-partum depression. - Diagnosis of anxiety and/or depression within last 3 years or -MOB's symptoms are currently being treated with medication and/or therapy.  Per chart review, MOB diagnosed with anxiety at age 34 (is now 34 years old). No acute concerns noted in her chart.  Please contact the Clinical Social Worker if needs arise or upon MOB request.   Lucita Ferrara, LCSW [phone]

## 2014-11-09 NOTE — Progress Notes (Signed)
Post Partum Day 1 Subjective: no complaints, up ad lib, voiding, tolerating PO and nl lochia, pain controlled  Objective: Blood pressure 112/60, pulse 92, temperature 98.2 F (36.8 C), temperature source Oral, resp. rate 18, height 5\' 4"  (1.626 m), weight 110.678 kg (244 lb), last menstrual period 01/29/2014, SpO2 100 %, unknown if currently breastfeeding.  Physical Exam:  General: alert and no distress Lochia: appropriate Uterine Fundus: firm   Recent Labs  11/08/14 1600 11/09/14 0555  HGB 9.9* 8.5*  HCT 30.4* 25.5*    Assessment/Plan: Plan for discharge tomorrow, Breastfeeding and Lactation consult.  Routine care.     LOS: 1 day   Bovard-Stuckert, Holly Whitehead 11/09/2014, 7:52 AM

## 2014-11-09 NOTE — Anesthesia Postprocedure Evaluation (Signed)
  Anesthesia Post-op Note  Patient: Holly Whitehead  Procedure(s) Performed: * No procedures listed *  Patient Location: Mother/Baby  Anesthesia Type:Epidural  Level of Consciousness: awake and alert   Airway and Oxygen Therapy: Patient Spontanous Breathing  Post-op Pain: mild  Post-op Assessment: Post-op Vital signs reviewed, Patient's Cardiovascular Status Stable, Respiratory Function Stable, No signs of Nausea or vomiting, Pain level controlled, No headache, Spinal receding and Patient able to bend at knees              Post-op Vital Signs: Reviewed  Last Vitals:  Filed Vitals:   11/09/14 0630  BP: 112/60  Pulse: 92  Temp: 36.8 C  Resp: 18    Complications: No apparent anesthesia complications

## 2014-11-10 MED ORDER — OXYCODONE-ACETAMINOPHEN 5-325 MG PO TABS: 1.0000 | ORAL_TABLET | Freq: Four times a day (QID) | ORAL | Status: DC | PRN

## 2014-11-10 MED ORDER — FERROUS SULFATE 325 (65 FE) MG PO TABS: 325.0000 mg | ORAL_TABLET | Freq: Two times a day (BID) | ORAL | Status: DC

## 2014-11-10 MED ORDER — MEDROXYPROGESTERONE ACETATE 150 MG/ML IM SUSP: 150.0000 mg | Freq: Once | INTRAMUSCULAR | Status: DC

## 2014-11-10 MED ORDER — IBUPROFEN 600 MG PO TABS: 600.0000 mg | ORAL_TABLET | Freq: Four times a day (QID) | ORAL | Status: DC | PRN

## 2014-11-10 NOTE — Discharge Summary (Signed)
NAMEAREAL, COCHRANE NO.:  [id number]  MEDICAL RECORD NO.:  [id number]  LOCATION:  9118                          FACILITY:  [facility]  PHYSICIAN:  Lucille Passy. Ulanda Edison, M.D. DATE OF BIRTH:  09/17/1980  DATE OF ADMISSION:  11/08/2014 DATE OF DISCHARGE:  11/10/2014                              DISCHARGE SUMMARY   HOSPITAL COURSE:  A 34 year old white female, para 2-0-1-2, at 40+ weeks gestation, with single umbilical artery, followed with biweekly nonstress test.  The patient was admitted for induction of labor.  She underwent induction of labor, received an epidural, by 7:11 p.m.  She was 4-5 cm, 90% effaced, vertex at a-1.  At 10:31 p.m. a viable healthy female was delivered by Dr. Melba Coon vaginally spontaneous delivery, ROA, it was a 10 second mild shoulder dystocia, relieved with McRoberts maneuver.  The patient only pushed 2-3 times for delivery.  Apgars were 9 and 9 at 1 and 5 minutes.  The patient is to sign her sterilization papers and she will be given Depo-Provera prior to discharge.  On the second postpartum day, the patient was afebrile.  Blood pressure was normal.  She was ambulating well without difficulty and ready for discharge.  Hemoglobin on admission 9.9, hematocrit 30.4, white count 9200, platelet count 269,000, and followup hemoglobin 8.5.  FINAL DIAGNOSES:  Intrauterine pregnancy 40+ weeks, delivered ROA single umbilical artery, anemia.  OPERATION:  Spontaneous delivery ROA, mild shoulder dystocia, corrected with McRoberts maneuver.  Right labial laceration repaired with 3-0 Vicryl repeat.  FINAL CONDITION:  Improved.  INSTRUCTIONS:  Include our regular discharge instruction booklet as well as the after visit summary.  She is to resume any or all the medications she was taking at home.  Over-the-counter prescriptions are Motrin 600 mg, 30 tablets, 1 every 6 hours as needed for pain;, Percocet 5/325, 30 tablets, 1 every 6 hours as needed for pain;  and ferrous sulfate 325 mg twice daily.  Return to the office in 6 weeks for followup examination. She will receive Depo-Provera prior to discharge.     Lucille Passy. Ulanda Edison, M.D.     TFH/MEDQ  D:  11/10/2014  T:  11/10/2014  Job:  [id number]

## 2014-11-10 NOTE — Progress Notes (Signed)
Patient ID: Holly Whitehead, female   DOB: [birth year], 34 y.o.   MRN: [id number] #2 afebrile BP normal HGB 9.9 to 8.5 No complaints for d/c

## 2014-11-10 NOTE — Discharge Instructions (Signed)
booklet °

## 2015-05-13 ENCOUNTER — Ambulatory Visit: Payer: Medicaid Other | Admitting: Skilled Nursing Facility1

## 2015-06-17 ENCOUNTER — Ambulatory Visit: Payer: Medicaid Other | Admitting: Skilled Nursing Facility1

## 2016-02-16 ENCOUNTER — Emergency Department (HOSPITAL_COMMUNITY)
Admission: EM | Admit: 2016-02-16 | Discharge: 2016-02-16 | Disposition: A | Discharge status: Home / Self Care | Payer: Medicaid Other | Attending: Emergency Medicine | Admitting: Emergency Medicine

## 2016-02-16 ENCOUNTER — Encounter (HOSPITAL_COMMUNITY): Payer: Self-pay | Admitting: Emergency Medicine

## 2016-02-16 DIAGNOSIS — X500XXA Overexertion from strenuous movement or load, initial encounter: Secondary | ICD-10-CM | POA: Insufficient documentation

## 2016-02-16 DIAGNOSIS — Y990 Civilian activity done for income or pay: Secondary | ICD-10-CM | POA: Diagnosis not present

## 2016-02-16 DIAGNOSIS — F17210 Nicotine dependence, cigarettes, uncomplicated: Secondary | ICD-10-CM | POA: Insufficient documentation

## 2016-02-16 DIAGNOSIS — S39012A Strain of muscle, fascia and tendon of lower back, initial encounter: Secondary | ICD-10-CM | POA: Insufficient documentation

## 2016-02-16 DIAGNOSIS — Z7982 Long term (current) use of aspirin: Secondary | ICD-10-CM | POA: Insufficient documentation

## 2016-02-16 DIAGNOSIS — N3000 Acute cystitis without hematuria: Secondary | ICD-10-CM | POA: Diagnosis not present

## 2016-02-16 DIAGNOSIS — Y929 Unspecified place or not applicable: Secondary | ICD-10-CM | POA: Diagnosis not present

## 2016-02-16 DIAGNOSIS — T148XXA Other injury of unspecified body region, initial encounter: Secondary | ICD-10-CM

## 2016-02-16 DIAGNOSIS — Y939 Activity, unspecified: Secondary | ICD-10-CM | POA: Insufficient documentation

## 2016-02-16 DIAGNOSIS — S3992XA Unspecified injury of lower back, initial encounter: Secondary | ICD-10-CM | POA: Diagnosis present

## 2016-02-16 DIAGNOSIS — M62830 Muscle spasm of back: Secondary | ICD-10-CM | POA: Insufficient documentation

## 2016-02-16 DIAGNOSIS — Z79899 Other long term (current) drug therapy: Secondary | ICD-10-CM | POA: Diagnosis not present

## 2016-02-16 DIAGNOSIS — M5441 Lumbago with sciatica, right side: Secondary | ICD-10-CM | POA: Diagnosis not present

## 2016-02-16 LAB — URINALYSIS, ROUTINE W REFLEX MICROSCOPIC
GLUCOSE, UA: NEGATIVE mg/dL
HGB URINE DIPSTICK: NEGATIVE
Ketones, ur: NEGATIVE mg/dL
NITRITE: NEGATIVE
Protein, ur: NEGATIVE mg/dL
SPECIFIC GRAVITY, URINE: 1.03 (ref 1.005–1.030)
pH: 5 (ref 5.0–8.0)

## 2016-02-16 LAB — POC URINE PREG, ED: Preg Test, Ur: NEGATIVE

## 2016-02-16 MED ORDER — NAPROXEN 500 MG PO TABS: 500.0000 mg | ORAL_TABLET | Freq: Two times a day (BID) | ORAL | 0 refills | Status: DC | PRN

## 2016-02-16 MED ORDER — CYCLOBENZAPRINE HCL 10 MG PO TABS: 10.0000 mg | ORAL_TABLET | Freq: Three times a day (TID) | ORAL | 0 refills | Status: DC | PRN

## 2016-02-16 MED ORDER — SULFAMETHOXAZOLE-TRIMETHOPRIM 800-160 MG PO TABS: 1.0000 | ORAL_TABLET | Freq: Two times a day (BID) | ORAL | 0 refills | Status: AC

## 2016-02-16 MED ORDER — PREDNISONE 20 MG PO TABS: ORAL_TABLET | ORAL | 0 refills | Status: DC

## 2016-02-16 MED ORDER — KETOROLAC TROMETHAMINE 30 MG/ML IJ SOLN
30.0000 mg | Freq: Once | INTRAMUSCULAR | Status: AC
  Administered 2016-02-16: 30 mg via INTRAMUSCULAR
  Filled 2016-02-16: qty 1

## 2016-02-16 NOTE — ED Provider Notes (Signed)
[facility] DEPT Provider Note   CSN: [id number] Arrival date & time: 02/16/16  1302  By signing my name below, I, Judithe Modest, attest that this documentation has been prepared under the direction and in the presence of Loreen Bankson Camprubi-Soms, PA-C. Electronically Signed: Judithe Modest, ER Scribe. 10/12/2015. 1:42 PM.  History   Chief Complaint Chief Complaint  Patient presents with  . Back Pain   The history is provided by the patient and medical records. No language interpreter was used.  Back Pain   This is a recurrent problem. The current episode started 2 days ago. The problem occurs constantly. The problem has not changed since onset.The pain is associated with no known injury. The pain is present in the lumbar spine. Quality: dull if seated, sharp if walking. The pain radiates to the right thigh. The pain is at a severity of 10/10. The pain is severe. The symptoms are aggravated by bending (and walking). The pain is the same all the time. Associated symptoms include dysuria and tingling (R thigh). Pertinent negatives include no chest pain, no fever, no numbness, no abdominal pain, no bowel incontinence, no perianal numbness, no bladder incontinence, no paresis and no weakness. She has tried NSAIDs and analgesics for the symptoms. The treatment provided mild relief.    HPI Comments: Holly Whitehead is a 35 y.o. female who presents to the Emergency Department complaining of gradually worsening lower back pain that started two days ago. 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 She describes the pain as 10/10 constant dull with sitting, sharp with moving, low back pain that radiates down the back of her right leg, worse with ambulation and bending/twisting. She took 800mg  ibuprofen yesterday with some relief. She took 1000mg  tylenol this morning without relief. Associated symptoms include tingling in  her R posterior thigh.  She also reports that she's been "fighting off a UTI for about 1-2 weeks" and states that it's been getting better with AZO, and has nearly resolved prior to onset of back pain so she didn't think it was related. Symptoms include urinary urgency/frequency and dysuria.   She denies fevers, chills, CP, SOB, abd pain, N/V/D/C, hematuria, vaginal bleeding/discharge, incontinence of urine/stool, saddle anesthesia/cauda equina symptoms, myalgias, arthralgias, numbness, weakness, or any other complaints/symptoms. She has NKDA. She has no PMHx of cancer.    Past Medical History:  Diagnosis Date  . Single artery and vein of umbilical cord affecting care of newborn 11/08/2014  . SVD (spontaneous vaginal delivery) 11/08/2014    Patient Active Problem List   Diagnosis Date Noted  . Pregnant 11/08/2014  . Single artery and vein of umbilical cord affecting care of newborn 11/08/2014  . SVD (spontaneous vaginal delivery) 11/08/2014    Past Surgical History:  Procedure Laterality Date  . NO PAST SURGERIES      OB History    Gravida Para Term Preterm AB Living   4 3 3  0 1 1   SAB TAB Ectopic Multiple Live Births   1 0 0 0 1       Home Medications    Prior to Admission medications   Medication Sig Start Date End Date Taking? Authorizing Provider  aspirin 81 MG chewable tablet Chew 1 tablet (81 mg total) by mouth daily. 12/02/13   Nicole Pisciotta, PA-C  Biotin 10 MG TABS Take 10 mg by mouth daily.    Historical Provider, MD  calcium  carbonate (TUMS EX) 750 MG chewable tablet Chew 2 tablets by mouth daily as needed for heartburn.    Historical Provider, MD  cyclobenzaprine (FLEXERIL) 10 MG tablet Take 5 mg by mouth 3 (three) times daily as needed for muscle spasms.    Historical Provider, MD  ferrous sulfate 325 (65 FE) MG tablet Take 1 tablet (325 mg total) by mouth 2 (two) times daily with a meal. 11/10/14   Newton Pigg, MD  hydrocortisone cream 1 % Apply 1 application  topically 2 (two) times daily as needed for itching.    Historical Provider, MD  ibuprofen (ADVIL,MOTRIN) 600 MG tablet Take 1 tablet (600 mg total) by mouth every 6 (six) hours as needed. 11/10/14   Newton Pigg, MD  medroxyPROGESTERone (DEPO-PROVERA) 150 MG/ML injection Inject 1 mL (150 mg total) into the muscle once. 11/10/14   Newton Pigg, MD  oxyCODONE-acetaminophen (PERCOCET/ROXICET) 5-325 MG per tablet Take 1 tablet by mouth every 6 (six) hours as needed (for pain scale 4-7). 11/10/14   Newton Pigg, MD  pantoprazole (PROTONIX) 20 MG tablet Take 20 mg by mouth daily.    Historical Provider, MD  Prenatal Vit-Fe Fumarate-FA (MULTIVITAMIN-PRENATAL) 27-0.8 MG TABS tablet Take 1 tablet by mouth daily at 12 noon.    Historical Provider, MD  vitamin E 400 UNIT capsule Take 400 Units by mouth daily.    Historical Provider, MD    Family History No family history on file.  Social History Social History  Substance Use Topics  . Smoking status: Current Every Day Smoker    Packs/day: 1.00    Types: Cigarettes  . Smokeless tobacco: Current User    Last attempt to quit: 10/20/2006  . Alcohol use Yes     Allergies   Bee venom and Adhesive [tape]   Review of Systems Review of Systems  Constitutional: Negative for chills and fever.  Respiratory: Negative for shortness of breath.   Cardiovascular: Negative for chest pain.  Gastrointestinal: Negative for abdominal pain, bowel incontinence, constipation, diarrhea, nausea and vomiting.  Genitourinary: Positive for dysuria, frequency and urgency. Negative for bladder incontinence, decreased urine volume, flank pain, hematuria, vaginal bleeding and vaginal discharge.  Musculoskeletal: Positive for back pain. Negative for myalgias.  Skin: Negative for color change.  Allergic/Immunologic: Negative for immunocompromised state.  Neurological: Positive for tingling (R thigh). Negative for weakness and numbness.  Psychiatric/Behavioral: Negative for  confusion.   A complete 10 system review of systems was obtained and all systems are negative except as noted in the HPI and PMH.   Physical Exam Updated Vital Signs BP 136/96 (BP Location: Right Arm)   Pulse 103   Temp 98.2 F (36.8 C) (Oral)   Resp 20   Ht 5\' 4"  (1.626 m)   Wt 211 lb 4 oz (95.8 kg)   LMP 01/23/2016   SpO2 100%   BMI 36.26 kg/m   Physical Exam  Constitutional: She is oriented to person, place, and time. Vital signs are normal. She appears well-developed and well-nourished.  Non-toxic appearance. No distress.  Afebrile, nontoxic, NAD  HENT:  Head: Normocephalic and atraumatic.  Mouth/Throat: Mucous membranes are normal.  Eyes: Conjunctivae and EOM are normal. Right eye exhibits no discharge. Left eye exhibits no discharge.  Neck: Normal range of motion. Neck supple.  Cardiovascular: Normal rate and intact distal pulses.   Pulmonary/Chest: Effort normal. No respiratory distress.  Abdominal: Soft. Normal appearance. She exhibits no distension. There is no tenderness. There is no rigidity, no rebound, no guarding and  no CVA tenderness.  Soft, NTND, no r/g/r, no CVA TTP  Musculoskeletal:       Lumbar back: She exhibits decreased range of motion (Due to pain), tenderness and spasm. She exhibits no bony tenderness and no deformity.       Back:  Lumbar spine with limited ROM due to pain, without spinous process TTP, no bony stepoffs or deformities, with mild bilateral paraspinous muscle TTP and muscle spasms. Strength and sensation grossly intact in all extremities, +SLR on the right side but negative on the left, gait steady. No overlying skin changes. Distal pulses intact.  Neurological: She is alert and oriented to person, place, and time. She has normal strength. No sensory deficit.  Skin: Skin is warm, dry and intact. No rash noted.  Psychiatric: She has a normal mood and affect. Her behavior is normal.  Nursing note and vitals reviewed.    ED Treatments /  Results  Labs (all labs ordered are listed, but only abnormal results are displayed) Labs Reviewed  URINALYSIS, ROUTINE W REFLEX MICROSCOPIC - Abnormal; Notable for the following:       Result Value   Color, Urine AMBER (*)    APPearance HAZY (*)    Bilirubin Urine SMALL (*)    Leukocytes, UA MODERATE (*)    Bacteria, UA FEW (*)    Squamous Epithelial / LPF 6-30 (*)    All other components within normal limits  URINE CULTURE  POC URINE PREG, ED    EKG  EKG Interpretation None       Radiology No results found.  Procedures Procedures (including critical care time)  Medications Ordered in ED Medications  ketorolac (TORADOL) 30 MG/ML injection 30 mg (30 mg Intramuscular Given 02/16/16 1344)     Initial Impression / Assessment and Plan / ED Course  I have reviewed the triage vital signs and the nursing notes.  Pertinent labs & imaging results that were available during my care of the patient were reviewed by me and considered in my medical decision making (see chart for details).  Clinical Course     35 y.o. female here with low back pain since doing some heavy lifting at work 2 days ago. No red flag s/s of low back pain. No s/s of central cord compression or cauda equina. Lower extremities are neurovascularly intact and patient is ambulating. No focal bony TTP, but diffuse b/l paraspinous muscle TTP and spasms noted. Pt also mentions that she had what she says is "a UTI" since last week which was getting better with Azo, and she doesn't feel it was related to the back pain because her frequency/urgency/dysuria has been improving. No flank or abd tenderness on exam. Will get U/A, Upreg, and give toradol; will reassess after, but doubt need for imaging or other labs at this time.   3:29 PM Upreg neg. U/A with moderate leuks, neg nitrites, TNTC WBC, 6-30 squamous, and few bacteria; could be contaminated, but given the fact that she has symptoms, will add-on UCx but treat with  bactrim; doubt pyelonephritis, back pain likely unrelated since it seemed to be a separate event/injury. Likely just muscle strain from heavy lifting. Pt feeling better after toradol. Patient was counseled on back pain precautions and told to do activity as tolerated but do not lift, push, or pull heavy objects more than 10 pounds for the next week. Patient counseled to use ice or heat on back for no longer than 15 minutes every hour.   Rx given for  muscle relaxer and counseled on proper use of muscle relaxant medication. Rx given for NSAIDs. Urged patient not to drink alcohol, drive, or perform any other activities that requires focus while taking medications. Additional tylenol encouraged. Rx prednisone given for sciatica symptoms.  Patient urged to follow-up with PCP in 1 wk for recheck, and if pain does not improve with treatment and rest or if pain becomes recurrent. Urged to return with worsening severe pain, loss of bowel or bladder control, trouble walking. The patient verbalizes understanding and agrees with the plan.   I personally performed the services described in this documentation, which was scribed in my presence. The recorded information has been reviewed and is accurate.    Final Clinical Impressions(s) / ED Diagnoses   Final diagnoses:  Acute bilateral low back pain with right-sided sciatica  Muscle spasm of back  Muscle strain  Acute cystitis without hematuria    New Prescriptions New Prescriptions   CYCLOBENZAPRINE (FLEXERIL) 10 MG TABLET    Take 1 tablet (10 mg total) by mouth 3 (three) times daily as needed for muscle spasms.   NAPROXEN (NAPROSYN) 500 MG TABLET    Take 1 tablet (500 mg total) by mouth 2 (two) times daily as needed for mild pain, moderate pain or headache (TAKE WITH MEALS.).   PREDNISONE (DELTASONE) 20 MG TABLET    3 tabs po daily x 3 days   SULFAMETHOXAZOLE-TRIMETHOPRIM (BACTRIM DS,SEPTRA DS) 800-160 MG TABLET    Take 1 tablet by mouth 2 (two) times  daily.     Thaxton Pelley Camprubi-Soms, PA-C 02/16/16 Uncertain, MD 02/17/16 [phone]

## 2016-02-16 NOTE — ED Triage Notes (Signed)
Pt. Stated, I can't think of any time I've injured my back, but I was moving boxes but never did I once said, I my, I mut have hurt my back.

## 2016-02-16 NOTE — ED Notes (Signed)
Declined W/C at D/C and was escorted to lobby by RN. 

## 2016-02-16 NOTE — Discharge Instructions (Signed)
For your urinary tract infection: Stay very well hydrated with plenty of water throughout the day. Take antibiotic until completed. Use tylenol or motrin as needed for pain. Follow up with your primary care physician in 1 week for recheck of ongoing symptoms but return to ER for emergent changing or worsening of symptoms. Please seek immediate care if you develop the following: You develop back pain.  Your symptoms are no better, or worse in 3 days. There is severe back pain or lower abdominal pain.  You develop chills.  You have a fever.  There is nausea or vomiting.  There is continued burning or discomfort with urination.   For your Back Pain: Your back pain should be treated with medicines such as ibuprofen or aleve and this back pain should get better over the next 2 weeks.  However if you develop severe or worsening pain, low back pain with fever, numbness, weakness or inability to walk or urinate, you should return to the ER immediately.  Please follow up with your doctor this week for a recheck if still having symptoms.  Avoid heavy lifting over 10 pounds over the next two weeks.  Low back pain is discomfort in the lower back that may be due to injuries to muscles and ligaments around the spine.  Occasionally, it may be caused by a a problem to a part of the spine called a disc.  The pain may last several days or a week;  However, most patients get completely well in 4 weeks.  Self - care:  The application of heat can help soothe the pain.  Maintaining your daily activities, including walking, is encourged, as it will help you get better faster than just staying in bed. Perform gentle stretching as discussed. Drink plenty of fluids.  Medications are also useful to help with pain control.  A commonly prescribed medication includes tylenol. Take as directed on the over the counter bottle.  Non steroidal anti inflammatory medications including Ibuprofen and naproxen;  These medications help  both pain and swelling and are very useful in treating back pain.  They should be taken with food, as they can cause stomach upset, and more seriously, stomach bleeding.    Muscle relaxants:  These medications can help with muscle tightness that is a cause of lower back pain.  Most of these medications can cause drowsiness, and it is not safe to drive or use dangerous machinery while taking them.  Prednisone: take as directed with breakfast, this will help with the tingling sensation in your leg.  SEEK IMMEDIATE MEDICAL ATTENTION IF: New numbness, tingling, weakness, or problem with the use of your arms or legs.  Severe back pain not relieved with medications.  Difficulty with or loss of control of your bowel or bladder control.  Increasing pain in any areas of the body (such as chest or abdominal pain).  Shortness of breath, dizziness or fainting.  Nausea (feeling sick to your stomach), vomiting, fever, or sweats.  You will need to follow up with  Your primary healthcare provider in 1-2 weeks for reassessment.

## 2016-02-18 LAB — URINE CULTURE

## 2016-02-19 ENCOUNTER — Telehealth (HOSPITAL_BASED_OUTPATIENT_CLINIC_OR_DEPARTMENT_OTHER): Payer: Self-pay

## 2016-02-19 NOTE — Telephone Encounter (Signed)
Post ED Visit - Positive Culture Follow-up  Culture report reviewed by antimicrobial stewardship pharmacist:  []  Elenor Quinones, Pharm.D. []  Heide Guile, Pharm.D., BCPS [x]  Parks Neptune, Pharm.D. []  Alycia Rossetti, Pharm.D., BCPS []  [place], [place].D., BCPS, AAHIVP []  Legrand Como, Pharm.D., BCPS, AAHIVP []  Milus Glazier, Pharm.D. []  Stephens November, Pharm.D.  Positive urine culture -> >/= 100,000 colonies -> E Coli Treated with Sulfa-Trimeth, organism sensitive to the same and no further patient follow-up is required at this time.  Dortha Kern 02/19/2016, 9:23 AM

## 2016-03-23 ENCOUNTER — Encounter (HOSPITAL_COMMUNITY): Payer: Self-pay | Admitting: Emergency Medicine

## 2016-03-23 DIAGNOSIS — Y939 Activity, unspecified: Secondary | ICD-10-CM

## 2016-03-23 DIAGNOSIS — Y92410 Unspecified street and highway as the place of occurrence of the external cause: Secondary | ICD-10-CM

## 2016-03-23 DIAGNOSIS — Z7982 Long term (current) use of aspirin: Secondary | ICD-10-CM | POA: Insufficient documentation

## 2016-03-23 DIAGNOSIS — F17210 Nicotine dependence, cigarettes, uncomplicated: Secondary | ICD-10-CM

## 2016-03-23 DIAGNOSIS — Y999 Unspecified external cause status: Secondary | ICD-10-CM

## 2016-03-23 DIAGNOSIS — M545 Low back pain: Secondary | ICD-10-CM

## 2016-03-23 DIAGNOSIS — M542 Cervicalgia: Secondary | ICD-10-CM | POA: Diagnosis not present

## 2016-03-23 DIAGNOSIS — Z5321 Procedure and treatment not carried out due to patient leaving prior to being seen by health care provider: Secondary | ICD-10-CM | POA: Insufficient documentation

## 2016-03-23 NOTE — ED Notes (Signed)
Patient provided stickers to registration.  Did not speak to RN.  Stated they were leaving.
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
## 2016-03-24 ENCOUNTER — Encounter (HOSPITAL_COMMUNITY): Payer: Self-pay | Admitting: Emergency Medicine

## 2016-03-24 ENCOUNTER — Emergency Department (HOSPITAL_COMMUNITY)
Admission: EM | Admit: 2016-03-24 | Discharge: 2016-03-24 | Disposition: A | Discharge status: Home / Self Care | Payer: Medicaid Other | Source: Home / Self Care

## 2016-03-24 ENCOUNTER — Emergency Department (HOSPITAL_COMMUNITY)
Admission: EM | Admit: 2016-03-24 | Discharge: 2016-03-24 | Disposition: A | Discharge status: Home / Self Care | Payer: Medicaid Other | Attending: Emergency Medicine | Admitting: Emergency Medicine

## 2016-03-24 DIAGNOSIS — M545 Low back pain, unspecified: Secondary | ICD-10-CM

## 2016-03-24 DIAGNOSIS — Y999 Unspecified external cause status: Secondary | ICD-10-CM | POA: Insufficient documentation

## 2016-03-24 DIAGNOSIS — M542 Cervicalgia: Secondary | ICD-10-CM

## 2016-03-24 DIAGNOSIS — Y939 Activity, unspecified: Secondary | ICD-10-CM | POA: Insufficient documentation

## 2016-03-24 DIAGNOSIS — F17210 Nicotine dependence, cigarettes, uncomplicated: Secondary | ICD-10-CM | POA: Insufficient documentation

## 2016-03-24 DIAGNOSIS — Z7982 Long term (current) use of aspirin: Secondary | ICD-10-CM | POA: Insufficient documentation

## 2016-03-24 DIAGNOSIS — Y92410 Unspecified street and highway as the place of occurrence of the external cause: Secondary | ICD-10-CM | POA: Insufficient documentation

## 2016-03-24 MED ORDER — METHOCARBAMOL 500 MG PO TABS: 500.0000 mg | ORAL_TABLET | Freq: Two times a day (BID) | ORAL | 0 refills | Status: DC

## 2016-03-24 MED ORDER — DICLOFENAC SODIUM 75 MG PO TBEC: 75.0000 mg | DELAYED_RELEASE_TABLET | Freq: Two times a day (BID) | ORAL | 0 refills | Status: DC
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
## 2016-03-24 NOTE — ED Notes (Signed)
Pt A&OX4, ambulatory at d/c with steady gait, NAD and verbalized understanding of d/c instructions, prescriptions and follow up care.
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
## 2016-06-30 ENCOUNTER — Other Ambulatory Visit: Payer: Self-pay | Admitting: Cardiovascular Disease

## 2016-06-30 ENCOUNTER — Ambulatory Visit
Admission: RE | Admit: 2016-06-30 | Discharge: 2016-06-30 | Disposition: A | Discharge status: Home / Self Care | Payer: Medicaid Other | Source: Ambulatory Visit | Attending: Cardiovascular Disease | Admitting: Cardiovascular Disease

## 2016-06-30 DIAGNOSIS — M542 Cervicalgia: Secondary | ICD-10-CM

## 2016-06-30 DIAGNOSIS — M545 Low back pain: Secondary | ICD-10-CM

## 2016-07-14 ENCOUNTER — Emergency Department (HOSPITAL_COMMUNITY)
Admission: EM | Admit: 2016-07-14 | Discharge: 2016-07-15 | Disposition: A | Discharge status: Home / Self Care | Payer: Medicaid Other | Attending: Emergency Medicine | Admitting: Emergency Medicine

## 2016-07-14 ENCOUNTER — Encounter (HOSPITAL_COMMUNITY): Payer: Self-pay | Admitting: Emergency Medicine

## 2016-07-14 DIAGNOSIS — Z7982 Long term (current) use of aspirin: Secondary | ICD-10-CM | POA: Insufficient documentation

## 2016-07-14 DIAGNOSIS — M5441 Lumbago with sciatica, right side: Secondary | ICD-10-CM | POA: Insufficient documentation

## 2016-07-14 DIAGNOSIS — F17210 Nicotine dependence, cigarettes, uncomplicated: Secondary | ICD-10-CM | POA: Insufficient documentation

## 2016-07-14 DIAGNOSIS — M545 Low back pain: Secondary | ICD-10-CM | POA: Diagnosis present

## 2016-07-14 DIAGNOSIS — M5431 Sciatica, right side: Secondary | ICD-10-CM
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
## 2016-07-15 MED ORDER — PREDNISONE 20 MG PO TABS
60.0000 mg | ORAL_TABLET | Freq: Once | ORAL | Status: AC
  Administered 2016-07-15: 60 mg via ORAL
  Filled 2016-07-15: qty 3

## 2016-07-15 MED ORDER — TIZANIDINE HCL 4 MG PO CAPS: 4.0000 mg | ORAL_CAPSULE | Freq: Three times a day (TID) | ORAL | 0 refills | Status: DC

## 2016-07-15 MED ORDER — CYCLOBENZAPRINE HCL 10 MG PO TABS
10.0000 mg | ORAL_TABLET | Freq: Once | ORAL | Status: AC
  Administered 2016-07-15: 10 mg via ORAL
  Filled 2016-07-15: qty 1

## 2016-07-15 MED ORDER — PREDNISONE 50 MG PO TABS: ORAL_TABLET | ORAL | 0 refills | Status: DC

## 2016-07-15 NOTE — Discharge Instructions (Signed)
Take the medication as directed. The muscle relaxant can make you sleepy so do not drive while taking it.
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
## 2016-07-31 ENCOUNTER — Ambulatory Visit (HOSPITAL_COMMUNITY)
Admission: EM | Admit: 2016-07-31 | Discharge: 2016-07-31 | Disposition: A | Discharge status: Home / Self Care | Payer: Medicaid Other | Attending: Family Medicine | Admitting: Family Medicine

## 2016-07-31 ENCOUNTER — Ambulatory Visit (INDEPENDENT_AMBULATORY_CARE_PROVIDER_SITE_OTHER): Payer: Medicaid Other

## 2016-07-31 ENCOUNTER — Encounter (HOSPITAL_COMMUNITY): Payer: Self-pay | Admitting: Emergency Medicine

## 2016-07-31 DIAGNOSIS — S62639A Displaced fracture of distal phalanx of unspecified finger, initial encounter for closed fracture: Secondary | ICD-10-CM

## 2016-07-31 HISTORY — DX: Malignant neoplasm of uterus, part unspecified: C55

## 2016-07-31 HISTORY — DX: Sciatica, unspecified side: M54.30

## 2016-07-31 NOTE — ED Provider Notes (Signed)
[facility]    CSN: [id number] Arrival date & time: 07/31/16  1722     History   Chief Complaint Chief Complaint  Patient presents with  . Finger Injury    HPI Holly Whitehead is a 36 y.o. female.   HPI   3 days ago, was moving a speaker and had her L middle finger (distal portion) smashed between the speaker and a van. She had immediate pain, swelling and bruising. She has been icing and elevating with no improvement. No numbness, tingling, or protruding bone.  Past Medical History:  Diagnosis Date  . Sciatica   . Single artery and vein of umbilical cord affecting care of newborn 11/08/2014  . SVD (spontaneous vaginal delivery) 11/08/2014  . Uterine cancer [facility])     Patient Active Problem List   Diagnosis Date Noted  . Pregnant 11/08/2014  . Single artery and vein of umbilical cord affecting care of newborn 11/08/2014  . SVD (spontaneous vaginal delivery) 11/08/2014    Past Surgical History:  Procedure Laterality Date  . NO PAST SURGERIES      OB History    Gravida Para Term Preterm AB Living   4 3 3  0 1 1   SAB TAB Ectopic Multiple Live Births   1 0 0 0 1       Home Medications    Prior to Admission medications   Medication Sig Start Date End Date Taking? Authorizing Provider  Homeopathic Products (BHI BACK PAIN RELIEF) TABS Take by mouth. otc back pain med for sciatica   Yes [provider]  ibuprofen (ADVIL,MOTRIN) 800 MG tablet Take 800 mg by mouth every 8 (eight) hours as needed.   Yes [provider]  aspirin 81 MG chewable tablet Chew 1 tablet (81 mg total) by mouth daily. 12/02/13   Pisciotta, Elmyra Ricks, PA-C  Biotin 10 MG TABS Take 10 mg by mouth daily.    [provider]  calcium carbonate (TUMS EX) 750 MG chewable tablet Chew 2 tablets by mouth daily as needed for heartburn.    [provider]  ferrous sulfate 325 (65 FE) MG tablet Take 1 tablet (325 mg total) by mouth 2 (two) times daily with a meal. 11/10/14    Newton Pigg, MD  hydrocortisone cream 1 % Apply 1 application topically 2 (two) times daily as needed for itching.    [provider]  medroxyPROGESTERone (DEPO-PROVERA) 150 MG/ML injection Inject 1 mL (150 mg total) into the muscle once. 11/10/14   Newton Pigg, MD  pantoprazole (PROTONIX) 20 MG tablet Take 20 mg by mouth daily.    [provider]  predniSONE (DELTASONE) 50 MG tablet Take one tablet PO daily. 07/15/16   Ashley Murrain, NP  Prenatal Vit-Fe Fumarate-FA (MULTIVITAMIN-PRENATAL) 27-0.8 MG TABS tablet Take 1 tablet by mouth daily at 12 noon.    [provider]  tiZANidine (ZANAFLEX) 4 MG capsule Take 1 capsule (4 mg total) by mouth 3 (three) times daily. 07/15/16   Ashley Murrain, NP  vitamin E 400 UNIT capsule Take 400 Units by mouth daily.    [provider]    Family History Family History  Problem Relation Age of Onset  . Diabetes Maternal Grandmother     Social History Social History  Substance Use Topics  . Smoking status: Current Every Day Smoker    Packs/day: 0.25    Types: Cigarettes  . Smokeless tobacco: Current User    Last attempt to quit: 10/20/2006  .  Alcohol use No     Allergies   Bee venom and Adhesive [tape]   Review of Systems Review of Systems  Musculoskeletal:       As noted in HPI  Neurological: Negative for numbness.     Physical Exam Triage Vital Signs ED Triage Vitals  Enc Vitals Group     BP 07/31/16 1758 123/83     Pulse Rate 07/31/16 1758 100     Resp 07/31/16 1758 14     Temp 07/31/16 1758 98.7 F (37.1 C)     Temp Source 07/31/16 1758 Oral     SpO2 07/31/16 1758 98 %   Updated Vital Signs BP 123/83 (BP Location: Left Arm)   Pulse 100   Temp 98.7 F (37.1 C) (Oral)   Resp 14   LMP 06/30/2016 (Approximate)   SpO2 98%     Physical Exam  Constitutional: She appears well-developed and well-nourished.  HENT:  Head: Normocephalic and atraumatic.  Eyes: EOM are normal.    Cardiovascular: Normal rate and regular rhythm.   Pulmonary/Chest: Effort normal.  Musculoskeletal:  Gross swelling noted over distal L 3rd phalanx DIP and less so over middle and prox phalanx. TTP over DIP and distal phalanx. Decreased ROM at DIP and PIP. +Ecchymosis on palmar surface.  Neurological: No sensory deficit.  Skin: Skin is warm and dry.  Psychiatric: She has a normal mood and affect. Judgment normal.     UC Treatments / Results  Radiology Dg Finger Middle Left  Result Date: 07/31/2016 CLINICAL DATA:  Crush injury to the distal third digit EXAM: LEFT MIDDLE FINGER 2+V COMPARISON:  None. FINDINGS: There is a comminuted third distal phalangeal tuft fracture without significant displacement of the fracture fragments IMPRESSION: Third distal phalangeal tuft fracture. Electronically Signed   By: Inez Catalina M.D.   On: 07/31/2016 18:36    Procedures Procedures - none  Initial Impression / Assessment and Plan / UC Course  I have reviewed the triage vital signs and the nursing notes.  Pertinent labs & imaging results that were available during my care of the patient were reviewed by me and considered in my medical decision making (see chart for details).     Fx as noted above. Neurovascularly intact. 4 prong splint and coban dressing applied and given to patient. Ibuprofen for pain. Continue to ice. Follow up with PCP Monday, a number to call for a new PCP as current one not currently accepting her insurance, for f/u and fx management vs referral. Pt discharged in stable condition. She voiced understanding and agreement to the plan.   Final Clinical Impressions(s) / UC Diagnoses   Final diagnoses:  Closed fracture of tuft of distal phalanx of finger      Shelda Pal, DO 07/31/16 1902

## 2016-07-31 NOTE — Discharge Instructions (Signed)
Ice/cold pack over area for 10-15 min every 2-3 hours while awake.  Ibuprofen 400-600 mg (2-3 over the counter strength tabs) every 6 hours as needed for pain.

## 2016-07-31 NOTE — ED Triage Notes (Signed)
Patient c/o finger injury on Tuesday. L middle finger was crushed by a speaker while she was moving it. Finger is discolored and she is having a hard time moving it. Used ice. Never bled.

## 2016-08-03 ENCOUNTER — Ambulatory Visit (INDEPENDENT_AMBULATORY_CARE_PROVIDER_SITE_OTHER): Payer: Medicaid Other | Admitting: Sports Medicine

## 2016-08-03 ENCOUNTER — Encounter: Payer: Self-pay | Admitting: Sports Medicine

## 2016-08-03 VITALS — BP 124/80 | Ht 64.0 in | Wt 183.0 lb

## 2016-08-03 DIAGNOSIS — G8929 Other chronic pain: Secondary | ICD-10-CM

## 2016-08-03 DIAGNOSIS — M5441 Lumbago with sciatica, right side: Secondary | ICD-10-CM | POA: Diagnosis present
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
## 2016-08-12 ENCOUNTER — Encounter: Payer: Self-pay | Admitting: Sports Medicine

## 2016-08-12 ENCOUNTER — Telehealth: Payer: Self-pay | Admitting: Sports Medicine

## 2016-08-12 NOTE — Telephone Encounter (Signed)
I spoke with the patient on the phone today after reviewing the MRI of her lumbar spine. She has a broad-based right paracentral foraminal disc protrusion at L5-S1 which encroaches upon the descending right S1 nerve root. I believe that this is her pain generator. She has had symptoms for several months along with worsening pain and increasing weakness. I recommend consultation with Dr. Lynann Bologna at [facility] to discuss treatment options. I'll defer further workup and treatment to his discretion. Patient will follow-up with me as needed.

## 2016-08-13 NOTE — Telephone Encounter (Signed)
Dr T[facility] [address] [place]  [phone]  Mon 08/17/16 at 345p PT NEEDS TO ARRIVE AT 3P TO FILL OUT ALL THE PAPERWORK

## 2016-08-17 ENCOUNTER — Other Ambulatory Visit: Payer: Medicaid Other

## 2016-08-17 DIAGNOSIS — M5416 Radiculopathy, lumbar region: Secondary | ICD-10-CM | POA: Diagnosis not present

## 2016-09-09 ENCOUNTER — Other Ambulatory Visit: Payer: Self-pay | Admitting: Orthopedic Surgery

## 2016-09-17 ENCOUNTER — Encounter (HOSPITAL_COMMUNITY)
Admission: RE | Admit: 2016-09-17 | Discharge: 2016-09-17 | Disposition: A | Discharge status: Home / Self Care | Payer: Medicaid Other | Source: Ambulatory Visit | Attending: Orthopedic Surgery | Admitting: Orthopedic Surgery

## 2016-09-17 ENCOUNTER — Ambulatory Visit (HOSPITAL_COMMUNITY)
Admission: RE | Admit: 2016-09-17 | Discharge: 2016-09-17 | Disposition: A | Discharge status: Home / Self Care | Payer: Medicaid Other | Source: Ambulatory Visit | Attending: Orthopedic Surgery | Admitting: Orthopedic Surgery

## 2016-09-17 ENCOUNTER — Encounter (HOSPITAL_COMMUNITY): Payer: Self-pay

## 2016-09-17 DIAGNOSIS — Z01812 Encounter for preprocedural laboratory examination: Secondary | ICD-10-CM | POA: Insufficient documentation

## 2016-09-17 DIAGNOSIS — Z01818 Encounter for other preprocedural examination: Secondary | ICD-10-CM

## 2016-09-17 DIAGNOSIS — M5416 Radiculopathy, lumbar region: Secondary | ICD-10-CM | POA: Insufficient documentation

## 2016-09-17 DIAGNOSIS — Z01810 Encounter for preprocedural cardiovascular examination: Secondary | ICD-10-CM | POA: Diagnosis not present

## 2016-09-17 HISTORY — DX: Headache, unspecified: R51.9

## 2016-09-17 HISTORY — DX: Anemia, unspecified: D64.9

## 2016-09-17 HISTORY — DX: Headache: R51

## 2016-09-17 HISTORY — DX: Anxiety disorder, unspecified: F41.9

## 2016-09-17 LAB — CBC WITH DIFFERENTIAL/PLATELET
BASOS PCT: 1 %
Basophils Absolute: 0 10*3/uL (ref 0.0–0.1)
EOS ABS: 0.1 10*3/uL (ref 0.0–0.7)
EOS PCT: 2 %
HCT: 35 % — ABNORMAL LOW (ref 36.0–46.0)
HEMOGLOBIN: 11.2 g/dL — AB (ref 12.0–15.0)
Lymphocytes Relative: 35 %
Lymphs Abs: 2 10*3/uL (ref 0.7–4.0)
MCH: 28.8 pg (ref 26.0–34.0)
MCHC: 32 g/dL (ref 30.0–36.0)
MCV: 90 fL (ref 78.0–100.0)
MONOS PCT: 8 %
Monocytes Absolute: 0.4 10*3/uL (ref 0.1–1.0)
NEUTROS PCT: 54 %
Neutro Abs: 3.1 10*3/uL (ref 1.7–7.7)
Platelets: 268 10*3/uL (ref 150–400)
RBC: 3.89 MIL/uL (ref 3.87–5.11)
RDW: 14.1 % (ref 11.5–15.5)
WBC: 5.7 10*3/uL (ref 4.0–10.5)

## 2016-09-17 LAB — URINALYSIS, ROUTINE W REFLEX MICROSCOPIC
Bilirubin Urine: NEGATIVE
Glucose, UA: NEGATIVE mg/dL
Hgb urine dipstick: NEGATIVE
Ketones, ur: NEGATIVE mg/dL
Leukocytes, UA: NEGATIVE
Nitrite: NEGATIVE
PROTEIN: NEGATIVE mg/dL
SPECIFIC GRAVITY, URINE: 1.024 (ref 1.005–1.030)
pH: 8 (ref 5.0–8.0)

## 2016-09-17 LAB — COMPREHENSIVE METABOLIC PANEL
ALBUMIN: 3.5 g/dL (ref 3.5–5.0)
ALK PHOS: 60 U/L (ref 38–126)
ALT: 12 U/L — ABNORMAL LOW (ref 14–54)
ANION GAP: 5 (ref 5–15)
AST: 15 U/L (ref 15–41)
BUN: 16 mg/dL (ref 6–20)
CALCIUM: 8.7 mg/dL — AB (ref 8.9–10.3)
CHLORIDE: 110 mmol/L (ref 101–111)
CO2: 26 mmol/L (ref 22–32)
Creatinine, Ser: 0.73 mg/dL (ref 0.44–1.00)
GFR calc non Af Amer: >60 mL/min (ref >60)
GLUCOSE: 83 mg/dL (ref 65–99)
POTASSIUM: 4.1 mmol/L (ref 3.5–5.1)
SODIUM: 141 mmol/L (ref 135–145)
Total Bilirubin: 0.3 mg/dL (ref 0.3–1.2)
Total Protein: 6.1 g/dL — ABNORMAL LOW (ref 6.5–8.1)

## 2016-09-17 LAB — SURGICAL PCR SCREEN
MRSA, PCR: NEGATIVE
Staphylococcus aureus: NEGATIVE

## 2016-09-17 LAB — HCG, SERUM, QUALITATIVE: PREG SERUM: NEGATIVE

## 2016-09-17 LAB — APTT: aPTT: 34 seconds (ref 24–36)

## 2016-09-17 LAB — PROTIME-INR
INR: 1.02
Prothrombin Time: 13.4 seconds (ref 11.4–15.2)

## 2016-09-17 NOTE — Pre-Procedure Instructions (Signed)
Maecyn Panning  [birth year]    Your procedure is scheduled on Thursday July 26.  Report to [facility] Admitting at 7:15 A.M.                 Your surgery or procedure is scheduled for 10:15 AM   Call this number if you have problems the morning of surgery:[phone] (pre- op desk)                  For any other questions, please call [phone], Monday - Friday 8 AM - 4 PM. (ask to speak to a nurse)   Remember:  Do not eat food or drink liquids after midnight Thursday, July 26.  Take these medicines the morning of surgery with A SIP OF WATER: tramadol (utlram) if needed  7 days prior to surgery STOP taking any Aspirin, Aleve, Naproxen, Ibuprofen, Motrin, Advil, Goody's, BC's, all herbal medications, fish oil, and all vitamins   Do not wear jewelry, make-up or nail polish.  Do not wear lotions, powders, or perfumes, or deoderant.  Do not shave 48 hours prior to surgery.  Men may shave face and neck.  Do not bring valuables to the hospital.  [facility] is not responsible for any belongings or valuables.  Contacts, dentures or bridgework may not be worn into surgery.  Leave your suitcase in the car.  After surgery it may be brought to your room.  For patients admitted to the hospital, discharge time will be determined by your treatment team.  Patients discharged the day of surgery will not be allowed to drive home.    Special instructions:  DO NOTSmoke within 24 hours of surgery  [place]- Preparing For Surgery  Before surgery, you can play an important role. Because skin is not sterile, your skin needs to be as free of germs as possible. You can reduce the number of germs on your skin by washing with CHG (chlorahexidine gluconate) Soap before surgery.  CHG is an antiseptic cleaner which kills germs and bonds with the skin to continue killing germs even after washing.  Please do not use if you have an allergy to CHG or antibacterial soaps. If your skin becomes  reddened/irritated stop using the CHG.  Do not shave (including legs and underarms) for at least 48 hours prior to first CHG shower. It is OK to shave your face.  Please follow these instructions carefully.   1. Shower the NIGHT BEFORE SURGERY and the MORNING OF SURGERY with CHG.   2. If you chose to wash your hair, wash your hair first as usual with your normal shampoo.  3. After you shampoo, rinse your hair and body thoroughly to remove the shampoo.  Wash your face and private area with your normal soap.   4. Use CHG as you would any other liquid soap. You can apply CHG directly to the skin and wash gently with a scrungie or a clean washcloth.   5. Apply the CHG Soap to your body ONLY FROM THE NECK DOWN.  Do not use on open wounds or open sores. Avoid contact with your eyes, ears, mouth and genitals (private parts). Wash genitals (private parts) with your normal soap.  6. Wash thoroughly, paying special attention to the area where your surgery will be performed.  7. Thoroughly rinse your body with warm water from the neck down.  8. DO NOT shower/wash with your normal soap after using and rinsing off the CHG Soap.  9. Pat yourself dry with a CLEAN TOWEL.   10. Wear CLEAN PAJAMAS   11. Place CLEAN SHEETS on your bed the night of your first shower and DO NOT SLEEP WITH PETS.  Day of Surgery: Shower as above. Do not apply any deodorants/lotions. Please wear clean clothes to the hospital/surgery center.    Do not wear jewelry, make-up or nail polish.  Do not wear lotions, powders, or perfumes, or deoderant.  Do not shave 48 hours prior to surgery.  Men may shave face and neck.  Do not bring valuables to the hospital.  [facility] is not responsible for any belongings or valuables.  Contacts, dentures or bridgework may not be worn into surgery.  Leave your suitcase in the car.  After surgery it may be brought to your room.  For patients admitted to the hospital, discharge time will  be determined by your treatment team.  Patients discharged the day of surgery will not be allowed to drive home.   Please read over the following fact sheets that you were given: Pain Booklet, Patient Instructions for Mupirocin Application, Incentive Spirometry, Surgical Site Infections.

## 2016-09-17 NOTE — Progress Notes (Signed)
PCP - Dr. Doylene Canard Chest x-ray - 09/17/2016  EKG - 09/17/2016   Pt denies cardiac hx or cardiac workup.  Patient denies shortness of breath, fever, cough and chest pain at PAT appointment  Patient verbalized understanding of instructions that were given to them at the PAT appointment. Patient was also instructed that they will need to review over the PAT instructions again at home before surgery.

## 2016-09-17 NOTE — Pre-Procedure Instructions (Addendum)
Jameelah Watts  [birth year]      [facility] - [place], [place] - [address] AT [place] [address] [place] [address] Phone: [phone] Fax: [phone]  [facility] [phone] - [place], [place] [address] Phone: [phone] Fax: [phone]    Your procedure is scheduled on Thursday July 26.  Report to [facility] Admitting at 8:15 A.M.  Call this number if you have problems the morning of surgery:  [phone] For any other questions, please call [phone], Monday - Friday 8 AM - 4 PM. (ask to speak to a nurse)   Remember:  Do not eat food or drink liquids after midnight.  Take these medicines the morning of surgery with A SIP OF WATER: tramadol (utlram) if needed  7 days prior to surgery STOP taking any Aspirin, Aleve, Naproxen, Ibuprofen, Motrin, Advil, Goody's, BC's, all herbal medications, fish oil, and all vitamins    Do not wear jewelry, make-up or nail polish.  Do not wear lotions, powders, or perfumes, or deoderant.  Do not shave 48 hours prior to surgery.  Men may shave face and neck.  Do not bring valuables to the hospital.  [facility] is not responsible for any belongings or valuables.  Contacts, dentures or bridgework may not be worn into surgery.  Leave your suitcase in the car.  After surgery it may be brought to your room.  For patients admitted to the hospital, discharge time will be determined by your treatment team.  Patients discharged the day of surgery will not be allowed to drive home.    Special instructions:    [place]- Preparing For Surgery  Before surgery, you can play an important role. Because skin is not sterile, your skin needs to be as free of germs as possible. You can reduce the number of germs on your skin by washing with CHG (chlorahexidine gluconate) Soap before surgery.   CHG is an antiseptic cleaner which kills germs and bonds with the skin to continue killing germs even after washing.  Please do not use if you have an allergy to CHG or antibacterial soaps. If your skin becomes reddened/irritated stop using the CHG.  Do not shave (including legs and underarms) for at least 48 hours prior to first CHG shower. It is OK to shave your face.  Please follow these instructions carefully.   1. Shower the NIGHT BEFORE SURGERY and the MORNING OF SURGERY with CHG.   2. If you chose to wash your hair, wash your hair first as usual with your normal shampoo.  3. After you shampoo, rinse your hair and body thoroughly to remove the shampoo.  4. Use CHG as you would any other liquid soap. You can apply CHG directly to the skin and wash gently with a scrungie or a clean washcloth.   5. Apply the CHG Soap to your body ONLY FROM THE NECK DOWN.  Do not use on open wounds or open sores. Avoid contact with your eyes, ears, mouth and genitals (private parts). Wash genitals (private parts) with your normal soap.  6. Wash thoroughly, paying special attention to the area where your surgery will be performed.  7. Thoroughly rinse your body with warm water from the neck down.  8. DO NOT shower/wash with your normal soap after using and rinsing off  the CHG Soap.  9. Pat yourself dry with a CLEAN TOWEL.   10. Wear CLEAN PAJAMAS   11. Place CLEAN SHEETS on your bed the night of your first shower and DO NOT SLEEP WITH PETS.    Day of Surgery: Do not apply any deodorants/lotions. Please wear clean clothes to the hospital/surgery center.      Please read over the following fact sheets that you were given. MRSA Information

## 2016-09-17 NOTE — Progress Notes (Signed)
Pt unable to obtain urine sample. To get sample DOS

## 2016-09-23 MED ORDER — CEFAZOLIN SODIUM-DEXTROSE 2-4 GM/100ML-% IV SOLN
2.0000 g | INTRAVENOUS | Status: AC
  Administered 2016-09-24: 2 g via INTRAVENOUS
  Filled 2016-09-23: qty 100

## 2016-09-24 ENCOUNTER — Ambulatory Visit (HOSPITAL_COMMUNITY): Payer: Medicaid Other | Admitting: Anesthesiology

## 2016-09-24 ENCOUNTER — Encounter (HOSPITAL_COMMUNITY): Payer: Self-pay

## 2016-09-24 ENCOUNTER — Encounter (HOSPITAL_COMMUNITY)
Admission: RE | Disposition: A | Discharge status: Home / Self Care | Payer: Self-pay | Source: Ambulatory Visit | Attending: Orthopedic Surgery

## 2016-09-24 ENCOUNTER — Ambulatory Visit (HOSPITAL_COMMUNITY)
Admission: RE | Admit: 2016-09-24 | Discharge: 2016-09-24 | Disposition: A | Discharge status: Home / Self Care | Payer: Medicaid Other | Source: Ambulatory Visit | Attending: Orthopedic Surgery | Admitting: Orthopedic Surgery

## 2016-09-24 ENCOUNTER — Ambulatory Visit (HOSPITAL_COMMUNITY): Payer: Medicaid Other

## 2016-09-24 DIAGNOSIS — Z888 Allergy status to other drugs, medicaments and biological substances status: Secondary | ICD-10-CM | POA: Insufficient documentation

## 2016-09-24 DIAGNOSIS — M5410 Radiculopathy, site unspecified: Secondary | ICD-10-CM

## 2016-09-24 DIAGNOSIS — F419 Anxiety disorder, unspecified: Secondary | ICD-10-CM | POA: Diagnosis not present

## 2016-09-24 DIAGNOSIS — Z91030 Bee allergy status: Secondary | ICD-10-CM | POA: Diagnosis not present

## 2016-09-24 DIAGNOSIS — Z6838 Body mass index (BMI) 38.0-38.9, adult: Secondary | ICD-10-CM | POA: Diagnosis not present

## 2016-09-24 DIAGNOSIS — Z8542 Personal history of malignant neoplasm of other parts of uterus: Secondary | ICD-10-CM | POA: Insufficient documentation

## 2016-09-24 DIAGNOSIS — M5117 Intervertebral disc disorders with radiculopathy, lumbosacral region: Secondary | ICD-10-CM | POA: Diagnosis present

## 2016-09-24 DIAGNOSIS — E669 Obesity, unspecified: Secondary | ICD-10-CM | POA: Insufficient documentation

## 2016-09-24 DIAGNOSIS — F17210 Nicotine dependence, cigarettes, uncomplicated: Secondary | ICD-10-CM | POA: Diagnosis not present

## 2016-09-24 HISTORY — PX: LUMBAR LAMINECTOMY/DECOMPRESSION MICRODISCECTOMY: SHX5026

## 2016-09-24 SURGERY — LUMBAR LAMINECTOMY/DECOMPRESSION MICRODISCECTOMY 1 LEVEL
Anesthesia: General | Site: Back | Laterality: Right

## 2016-09-24 MED ORDER — FENTANYL CITRATE (PF) 100 MCG/2ML IJ SOLN
INTRAMUSCULAR | Status: DC | PRN
  Administered 2016-09-24: 150 ug via INTRAVENOUS
  Administered 2016-09-24: 100 ug via INTRAVENOUS
  Administered 2016-09-24 (×5): 50 ug via INTRAVENOUS

## 2016-09-24 MED ORDER — INDIGOTINDISULFONATE SODIUM 8 MG/ML IJ SOLN
INTRAMUSCULAR | Status: DC | PRN
  Administered 2016-09-24: 1 mL

## 2016-09-24 MED ORDER — PROPOFOL 10 MG/ML IV BOLUS
INTRAVENOUS | Status: AC
  Filled 2016-09-24: qty 20

## 2016-09-24 MED ORDER — THROMBIN 5000 UNITS EX SOLR
CUTANEOUS | Status: AC
  Filled 2016-09-24: qty 5000

## 2016-09-24 MED ORDER — SUGAMMADEX SODIUM 200 MG/2ML IV SOLN
INTRAVENOUS | Status: DC | PRN
  Administered 2016-09-24: 200 mg via INTRAVENOUS

## 2016-09-24 MED ORDER — OXYCODONE-ACETAMINOPHEN 5-325 MG PO TABS
ORAL_TABLET | ORAL | Status: AC
  Filled 2016-09-24: qty 1

## 2016-09-24 MED ORDER — METHYLPREDNISOLONE ACETATE 40 MG/ML IJ SUSP
INTRAMUSCULAR | Status: DC | PRN
  Administered 2016-09-24: 40 mg

## 2016-09-24 MED ORDER — LACTATED RINGERS IV SOLN
INTRAVENOUS | Status: DC
  Administered 2016-09-24 (×3): via INTRAVENOUS

## 2016-09-24 MED ORDER — PHENYLEPHRINE HCL 10 MG/ML IJ SOLN
INTRAMUSCULAR | Status: DC | PRN
  Administered 2016-09-24 (×4): 80 ug via INTRAVENOUS

## 2016-09-24 MED ORDER — HYDROMORPHONE HCL 1 MG/ML IJ SOLN
0.2500 mg | INTRAMUSCULAR | Status: DC | PRN
Start: 2016-09-24 — End: 2016-09-24
  Administered 2016-09-24 (×2): 0.5 mg via INTRAVENOUS

## 2016-09-24 MED ORDER — DIAZEPAM 5 MG PO TABS
ORAL_TABLET | ORAL | Status: AC
  Filled 2016-09-24: qty 1

## 2016-09-24 MED ORDER — FENTANYL CITRATE (PF) 250 MCG/5ML IJ SOLN
INTRAMUSCULAR | Status: AC
  Filled 2016-09-24: qty 5

## 2016-09-24 MED ORDER — LIDOCAINE HCL (CARDIAC) 20 MG/ML IV SOLN
INTRAVENOUS | Status: DC | PRN
  Administered 2016-09-24: 100 mg via INTRAVENOUS

## 2016-09-24 MED ORDER — METHYLENE BLUE 0.5 % INJ SOLN
INTRAVENOUS | Status: AC
  Filled 2016-09-24: qty 10

## 2016-09-24 MED ORDER — MIDAZOLAM HCL 5 MG/5ML IJ SOLN
INTRAMUSCULAR | Status: DC | PRN
  Administered 2016-09-24: 2 mg via INTRAVENOUS

## 2016-09-24 MED ORDER — KETOROLAC TROMETHAMINE 30 MG/ML IJ SOLN
INTRAMUSCULAR | Status: AC
  Administered 2016-09-24: 30 mg via INTRAVENOUS
  Filled 2016-09-24: qty 1

## 2016-09-24 MED ORDER — THROMBIN 5000 UNITS EX SOLR
CUTANEOUS | Status: DC | PRN
  Administered 2016-09-24 (×2): 5000 [IU] via TOPICAL

## 2016-09-24 MED ORDER — FENTANYL CITRATE (PF) 100 MCG/2ML IJ SOLN
50.0000 ug | Freq: Once | INTRAMUSCULAR | Status: AC
  Administered 2016-09-24: 50 ug via INTRAVENOUS

## 2016-09-24 MED ORDER — ONDANSETRON HCL 4 MG/2ML IJ SOLN
INTRAMUSCULAR | Status: AC
  Filled 2016-09-24: qty 2

## 2016-09-24 MED ORDER — KETOROLAC TROMETHAMINE 30 MG/ML IJ SOLN
30.0000 mg | Freq: Once | INTRAMUSCULAR | Status: DC | PRN
  Administered 2016-09-24: 30 mg via INTRAVENOUS

## 2016-09-24 MED ORDER — METHYLPREDNISOLONE ACETATE 40 MG/ML IJ SUSP
INTRAMUSCULAR | Status: AC
  Filled 2016-09-24: qty 1

## 2016-09-24 MED ORDER — MEPERIDINE HCL 25 MG/ML IJ SOLN: 6.2500 mg | INTRAMUSCULAR | Status: DC | PRN

## 2016-09-24 MED ORDER — LIDOCAINE 2% (20 MG/ML) 5 ML SYRINGE
INTRAMUSCULAR | Status: AC
  Filled 2016-09-24: qty 5

## 2016-09-24 MED ORDER — PROPOFOL 10 MG/ML IV BOLUS
INTRAVENOUS | Status: DC | PRN
  Administered 2016-09-24: 200 mg via INTRAVENOUS

## 2016-09-24 MED ORDER — MINERAL OIL LIGHT 100 % EX OIL
TOPICAL_OIL | CUTANEOUS | Status: AC
  Filled 2016-09-24: qty 25

## 2016-09-24 MED ORDER — MINERAL OIL LIGHT 100 % EX OIL
TOPICAL_OIL | CUTANEOUS | Status: DC | PRN
  Administered 2016-09-24: 1 via TOPICAL

## 2016-09-24 MED ORDER — ROCURONIUM BROMIDE 100 MG/10ML IV SOLN
INTRAVENOUS | Status: DC | PRN
  Administered 2016-09-24: 60 mg via INTRAVENOUS

## 2016-09-24 MED ORDER — DIAZEPAM 5 MG PO TABS
5.0000 mg | ORAL_TABLET | Freq: Once | ORAL | Status: AC
  Administered 2016-09-24: 5 mg via ORAL

## 2016-09-24 MED ORDER — BUPIVACAINE LIPOSOME 1.3 % IJ SUSP
20.0000 mL | INTRAMUSCULAR | Status: AC
  Administered 2016-09-24: 20 mL
  Filled 2016-09-24: qty 20

## 2016-09-24 MED ORDER — PHENYLEPHRINE 40 MCG/ML (10ML) SYRINGE FOR IV PUSH (FOR BLOOD PRESSURE SUPPORT)
PREFILLED_SYRINGE | INTRAVENOUS | Status: AC
  Filled 2016-09-24: qty 10

## 2016-09-24 MED ORDER — POVIDONE-IODINE 7.5 % EX SOLN: Freq: Once | CUTANEOUS | Status: DC

## 2016-09-24 MED ORDER — BUPIVACAINE-EPINEPHRINE 0.25% -1:200000 IJ SOLN
INTRAMUSCULAR | Status: DC | PRN
  Administered 2016-09-24: 28 mL

## 2016-09-24 MED ORDER — HYDROMORPHONE HCL 1 MG/ML IJ SOLN
INTRAMUSCULAR | Status: AC
  Filled 2016-09-24: qty 1

## 2016-09-24 MED ORDER — DEXAMETHASONE SODIUM PHOSPHATE 10 MG/ML IJ SOLN
INTRAMUSCULAR | Status: DC | PRN
  Administered 2016-09-24: 10 mg via INTRAVENOUS

## 2016-09-24 MED ORDER — OXYCODONE-ACETAMINOPHEN 5-325 MG PO TABS
1.0000 | ORAL_TABLET | Freq: Once | ORAL | Status: AC
  Administered 2016-09-24: 1 via ORAL

## 2016-09-24 MED ORDER — MIDAZOLAM HCL 2 MG/2ML IJ SOLN
INTRAMUSCULAR | Status: AC
  Filled 2016-09-24: qty 2

## 2016-09-24 MED ORDER — FENTANYL CITRATE (PF) 100 MCG/2ML IJ SOLN
INTRAMUSCULAR | Status: AC
  Administered 2016-09-24: 50 ug via INTRAVENOUS
  Filled 2016-09-24: qty 2

## 2016-09-24 MED ORDER — PROMETHAZINE HCL 25 MG/ML IJ SOLN: 6.2500 mg | INTRAMUSCULAR | Status: DC | PRN

## 2016-09-24 MED ORDER — ONDANSETRON HCL 4 MG/2ML IJ SOLN
INTRAMUSCULAR | Status: DC | PRN
  Administered 2016-09-24: 4 mg via INTRAVENOUS

## 2016-09-24 MED ORDER — SUGAMMADEX SODIUM 200 MG/2ML IV SOLN
INTRAVENOUS | Status: AC
Start: 2016-09-24 — End: ?
  Filled 2016-09-24: qty 2

## 2016-09-24 MED ORDER — BUPIVACAINE-EPINEPHRINE (PF) 0.25% -1:200000 IJ SOLN
INTRAMUSCULAR | Status: AC
  Filled 2016-09-24: qty 30

## 2016-09-24 SURGICAL SUPPLY — 71 items
BENZOIN TINCTURE PRP APPL 2/3 (GAUZE/BANDAGES/DRESSINGS) ×3 IMPLANT
BUR ROUND PRECISION 4.0 (BURR) ×2 IMPLANT
BUR ROUND PRECISION 4.0MM (BURR) ×1
CANISTER SUCT 3000ML PPV (MISCELLANEOUS) ×3 IMPLANT
CARTRIDGE OIL MAESTRO DRILL (MISCELLANEOUS) ×1 IMPLANT
CLOSURE STERI-STRIP 1/2X4 (GAUZE/BANDAGES/DRESSINGS) ×1
CLOSURE WOUND 1/2 X4 (GAUZE/BANDAGES/DRESSINGS)
CLSR STERI-STRIP ANTIMIC 1/2X4 (GAUZE/BANDAGES/DRESSINGS) ×2 IMPLANT
CORDS BIPOLAR (ELECTRODE) ×3 IMPLANT
COVER SURGICAL LIGHT HANDLE (MISCELLANEOUS) ×3 IMPLANT
DIFFUSER DRILL AIR PNEUMATIC (MISCELLANEOUS) ×3 IMPLANT
DRAIN CHANNEL 15F RND FF W/TCR (WOUND CARE) IMPLANT
DRAPE POUCH INSTRU U-SHP 10X18 (DRAPES) ×6 IMPLANT
DRAPE SURG 17X23 STRL (DRAPES) ×12 IMPLANT
DURAPREP 26ML APPLICATOR (WOUND CARE) ×3 IMPLANT
ELECT BLADE 4.0 EZ CLEAN MEGAD (MISCELLANEOUS)
ELECT CAUTERY BLADE 6.4 (BLADE) ×3 IMPLANT
ELECT REM PT RETURN 9FT ADLT (ELECTROSURGICAL) ×3
ELECTRODE BLDE 4.0 EZ CLN MEGD (MISCELLANEOUS) IMPLANT
ELECTRODE REM PT RTRN 9FT ADLT (ELECTROSURGICAL) ×1 IMPLANT
EVACUATOR SILICONE 100CC (DRAIN) IMPLANT
FILTER STRAW FLUID ASPIR (MISCELLANEOUS) ×3 IMPLANT
GAUZE SPONGE 4X4 12PLY STRL (GAUZE/BANDAGES/DRESSINGS) ×3 IMPLANT
GAUZE SPONGE 4X4 16PLY XRAY LF (GAUZE/BANDAGES/DRESSINGS) ×3 IMPLANT
GLOVE BIO SURGEON STRL SZ7 (GLOVE) ×3 IMPLANT
GLOVE BIO SURGEON STRL SZ8 (GLOVE) ×3 IMPLANT
GLOVE BIOGEL PI IND STRL 7.0 (GLOVE) ×1 IMPLANT
GLOVE BIOGEL PI IND STRL 8 (GLOVE) ×1 IMPLANT
GLOVE BIOGEL PI INDICATOR 7.0 (GLOVE) ×2
GLOVE BIOGEL PI INDICATOR 8 (GLOVE) ×2
GOWN STRL REUS W/ TWL LRG LVL3 (GOWN DISPOSABLE) ×1 IMPLANT
GOWN STRL REUS W/ TWL XL LVL3 (GOWN DISPOSABLE) ×2 IMPLANT
GOWN STRL REUS W/TWL LRG LVL3 (GOWN DISPOSABLE) ×2
GOWN STRL REUS W/TWL XL LVL3 (GOWN DISPOSABLE) ×4
IV CATH 14GX2 1/4 (CATHETERS) ×3 IMPLANT
KIT BASIN OR (CUSTOM PROCEDURE TRAY) ×3 IMPLANT
KIT POSITION SURG JACKSON T1 (MISCELLANEOUS) ×3 IMPLANT
KIT ROOM TURNOVER OR (KITS) ×3 IMPLANT
NEEDLE 18GX1X1/2 (RX/OR ONLY) (NEEDLE) ×3 IMPLANT
NEEDLE 22X1 1/2 (OR ONLY) (NEEDLE) ×3 IMPLANT
NEEDLE HYPO 25GX1X1/2 BEV (NEEDLE) ×3 IMPLANT
NEEDLE SPNL 18GX3.5 QUINCKE PK (NEEDLE) ×6 IMPLANT
NS IRRIG 1000ML POUR BTL (IV SOLUTION) ×3 IMPLANT
OIL CARTRIDGE MAESTRO DRILL (MISCELLANEOUS) ×3
PACK LAMINECTOMY ORTHO (CUSTOM PROCEDURE TRAY) ×3 IMPLANT
PACK UNIVERSAL I (CUSTOM PROCEDURE TRAY) ×3 IMPLANT
PAD ARMBOARD 7.5X6 YLW CONV (MISCELLANEOUS) ×6 IMPLANT
PATTIES SURGICAL .5 X.5 (GAUZE/BANDAGES/DRESSINGS) IMPLANT
PATTIES SURGICAL .5 X1 (DISPOSABLE) ×3 IMPLANT
SPONGE INTESTINAL PEANUT (DISPOSABLE) ×3 IMPLANT
SPONGE SURGIFOAM ABS GEL 100 (HEMOSTASIS) ×3 IMPLANT
SPONGE SURGIFOAM ABS GEL SZ50 (HEMOSTASIS) ×3 IMPLANT
STRIP CLOSURE SKIN 1/2X4 (GAUZE/BANDAGES/DRESSINGS) IMPLANT
SURGIFLO W/THROMBIN 8M KIT (HEMOSTASIS) ×3 IMPLANT
SUT MNCRL AB 4-0 PS2 18 (SUTURE) ×3 IMPLANT
SUT VIC AB 0 CT1 18XCR BRD 8 (SUTURE) IMPLANT
SUT VIC AB 0 CT1 27 (SUTURE)
SUT VIC AB 0 CT1 27XBRD ANBCTR (SUTURE) IMPLANT
SUT VIC AB 0 CT1 8-18 (SUTURE)
SUT VIC AB 1 CT1 18XCR BRD 8 (SUTURE) ×1 IMPLANT
SUT VIC AB 1 CT1 8-18 (SUTURE) ×2
SUT VIC AB 2-0 CT2 18 VCP726D (SUTURE) ×3 IMPLANT
SYR 20CC LL (SYRINGE) IMPLANT
SYR BULB IRRIGATION 50ML (SYRINGE) ×3 IMPLANT
SYR CONTROL 10ML LL (SYRINGE) ×6 IMPLANT
SYR TB 1ML 26GX3/8 SAFETY (SYRINGE) ×6 IMPLANT
SYR TB 1ML LUER SLIP (SYRINGE) ×6 IMPLANT
TOWEL OR 17X24 6PK STRL BLUE (TOWEL DISPOSABLE) ×3 IMPLANT
TOWEL OR 17X26 10 PK STRL BLUE (TOWEL DISPOSABLE) ×3 IMPLANT
WATER STERILE IRR 1000ML POUR (IV SOLUTION) ×3 IMPLANT
YANKAUER SUCT BULB TIP NO VENT (SUCTIONS) ×3 IMPLANT

## 2016-09-24 NOTE — Anesthesia Procedure Notes (Signed)
Procedure Name: Intubation Date/Time: 09/24/2016 2:57 PM Performed by: Rebekah Chesterfield L Pre-anesthesia Checklist: Patient identified, Emergency Drugs available, Suction available and Patient being monitored Patient Re-evaluated:Patient Re-evaluated prior to induction Oxygen Delivery Method: Circle System Utilized Preoxygenation: Pre-oxygenation with 100% oxygen Induction Type: IV induction Ventilation: Mask ventilation without difficulty Laryngoscope Size: Mac and 3 Grade View: Grade I Tube type: Oral Tube size: 7.5 mm Number of attempts: 1 Airway Equipment and Method: Stylet and Oral airway Placement Confirmation: ETT inserted through vocal cords under direct vision,  positive ETCO2 and breath sounds checked- equal and bilateral Secured at: 20 cm Tube secured with: Tape Dental Injury: Teeth and Oropharynx as per pre-operative assessment

## 2016-09-24 NOTE — H&P (Signed)
     PREOPERATIVE H&P  Chief Complaint: R leg pain  HPI: Holly Whitehead is a 36 y.o. female who presents with ongoing pain in the right leg  MRI reveals right L5/S1 HNP  Patient has failed multiple forms of conservative care and continues to have pain (see office notes for additional details regarding the patient's full course of treatment)  Past Medical History:  Diagnosis Date  . Anemia   . Anxiety   . Headache    migraines  . Sciatica   . Single artery and vein of umbilical cord affecting care of newborn 11/08/2014  . SVD (spontaneous vaginal delivery) 11/08/2014  . Uterine cancer [facility])    Past Surgical History:  Procedure Laterality Date  . NO PAST SURGERIES     Social History   Social History  . Marital status: Married    Spouse name: N/A  . Number of children: N/A  . Years of education: N/A   Social History Main Topics  . Smoking status: Current Every Day Smoker    Packs/day: 0.25    Types: Cigarettes  . Smokeless tobacco: Current User    Last attempt to quit: 10/20/2006  . Alcohol use No  . Drug use: No  . Sexual activity: Yes    Birth control/ protection: None   Other Topics Concern  . Not on file   Social History Narrative  . No narrative on file   Family History  Problem Relation Age of Onset  . Diabetes Maternal Grandmother    Allergies  Allergen Reactions  . Bee Venom Swelling    Extreme swelling  . Adhesive [Tape] Rash   Prior to Admission medications   Medication Sig Start Date End Date Taking? Authorizing Provider  Ibuprofen 200 MG CAPS Take 800 mg by mouth every 8 (eight) hours as needed for mild pain or moderate pain.    Yes [provider]  naproxen sodium (ALEVE) 220 MG tablet Take 880 mg by mouth every evening.   Yes [provider]  traMADol (ULTRAM) 50 MG tablet Take 100 mg by mouth 2 (two) times daily.   Yes [provider]  Biotin 10 MG TABS Take 10 mg by mouth daily.    [provider]    vitamin E 400 UNIT capsule Take 400 Units by mouth daily.    [provider]     All other systems have been reviewed and were otherwise negative with the exception of those mentioned in the HPI and as above.  Physical Exam: There were no vitals filed for this visit.  General: Alert, no acute distress Cardiovascular: No pedal edema Respiratory: No cyanosis, no use of accessory musculature Skin: No lesions in the area of chief complaint Neurologic: Sensation intact distally Psychiatric: Patient is competent for consent with normal mood and affect Lymphatic: No axillary or cervical lymphadenopathy  MUSCULOSKELETAL: + SLR on the right  Assessment/Plan: Right leg pain Plan for Procedure(s): RIGHT SIDED LUMBAR 5-SACRUM 1 MICRODISCECTOMY   Sinclair Ship, MD 09/24/2016 7:16 AM

## 2016-09-24 NOTE — Anesthesia Preprocedure Evaluation (Signed)
Anesthesia Evaluation  Patient identified by MRN, date of birth, ID band Patient awake    Reviewed: Allergy & Precautions, NPO status , Patient's Chart, lab work & pertinent test results  Airway Mallampati: II       Dental no notable dental hx. (+) Teeth Intact   Pulmonary Current Smoker,    Pulmonary exam normal breath sounds clear to auscultation       Cardiovascular Normal cardiovascular exam Rhythm:Regular Rate:Normal     Neuro/Psych    GI/Hepatic   Endo/Other    Renal/GU      Musculoskeletal   Abdominal (+) + obese,   Peds  Hematology  (+) Blood dyscrasia, anemia ,   Anesthesia Other Findings   Reproductive/Obstetrics negative OB ROS                             Anesthesia Physical Anesthesia Plan  ASA: II  Anesthesia Plan: General   Post-op Pain Management:    Induction: Intravenous  PONV Risk Score and Plan: 4 or greater and Ondansetron, Dexamethasone, Propofol, Midazolam and Scopolamine patch - Pre-op  Airway Management Planned: Oral ETT  Additional Equipment:   Intra-op Plan:   Post-operative Plan: Extubation in OR  Informed Consent: I have reviewed the patients History and Physical, chart, labs and discussed the procedure including the risks, benefits and alternatives for the proposed anesthesia with the patient or authorized representative who has indicated his/her understanding and acceptance.   Dental advisory given  Plan Discussed with: CRNA and Surgeon  Anesthesia Plan Comments:         Anesthesia Quick Evaluation

## 2016-09-24 NOTE — Transfer of Care (Signed)
Immediate Anesthesia Transfer of Care Note  Patient: Holly Whitehead  Procedure(s) Performed: Procedure(s) with comments: RIGHT SIDED LUMBAR 5-SACRUM 1 MICRODISCECTOMY (Right) - RIGHT SIDED LUMBAR 5-SACRUM 1 MICRODISCECTOMY  Patient Location: PACU  Anesthesia Type:General  Level of Consciousness: awake, alert  and oriented  Airway & Oxygen Therapy: Patient Spontanous Breathing and Patient connected to nasal cannula oxygen  Post-op Assessment: Report given to RN, Post -op Vital signs reviewed and stable and Patient moving all extremities  Post vital signs: Reviewed and stable  Last Vitals:  Vitals:   09/24/16 0817  BP: 124/73  Pulse: (!) 113  Resp: 18  Temp: (!) 34.7 C    Last Pain:  Vitals:   09/24/16 0817  TempSrc: Oral  PainSc: 7       Patients Stated Pain Goal: 5 (63/01/60 1093)  Complications: No apparent anesthesia complications

## 2016-09-25 ENCOUNTER — Encounter (HOSPITAL_COMMUNITY): Payer: Self-pay | Admitting: Orthopedic Surgery

## 2016-09-25 NOTE — Anesthesia Postprocedure Evaluation (Signed)
Anesthesia Post Note  Patient: Holly Whitehead  Procedure(s) Performed: Procedure(s) (LRB): RIGHT SIDED LUMBAR 5-SACRUM 1 MICRODISCECTOMY (Right)     Patient location during evaluation: PACU Anesthesia Type: General Level of consciousness: awake and sedated Pain management: pain level controlled Vital Signs Assessment: post-procedure vital signs reviewed and stable Respiratory status: spontaneous breathing, nonlabored ventilation, respiratory function stable and patient connected to nasal cannula oxygen Cardiovascular status: blood pressure returned to baseline and stable Postop Assessment: no signs of nausea or vomiting Anesthetic complications: no    Last Vitals:  Vitals:   09/24/16 1810 09/24/16 1820  BP: 108/80 126/87  Pulse: (!) 101 96  Resp: 18 15  Temp: 36.7 C     Last Pain:  Vitals:   09/24/16 1820  TempSrc:   PainSc: 3                  Aireona Torelli,JAMES TERRILL

## 2016-09-25 NOTE — Op Note (Signed)
NAME:  Holly Whitehead, Holly Whitehead NO.:  MEDICAL RECORD NO.:  [id number]  PHYSICIAN:  Phylliss Bob, MD      DATE OF BIRTH:  [birth year]  DATE OF PROCEDURE:  09/24/2016                              OPERATIVE REPORT   PREOPERATIVE DIAGNOSES: 1. Right-sided S1 radiculopathy. 2. Right-sided L5-S1 disk herniation.  POSTOPERATIVE DIAGNOSES: 1. Right-sided S1 radiculopathy. 2. Right-sided L5-S1 disk herniation.  PROCEDURE:  Right-sided L5-S1 laminotomy with partial facetectomy and removal of herniated right L5-S1 intervertebral disk herniation compressing the right S1 nerve.  SURGEON:  Phylliss Bob, MD.  ASSISTANTPricilla Holm, PA-C.  ANESTHESIA:  General endotracheal anesthesia.  COMPLICATIONS:  None.  DISPOSITION:  Stable.  ESTIMATED BLOOD LOSS:  Minimal.  INDICATIONS FOR SURGERY:  Briefly, [name] is a very pleasant 36 year old female, who did present to me with severe and rather debilitating pain in her right leg.  The patient's MRI did reveal a moderate right- sided L5-S1 disk herniation, compressing the right S1 nerve.  We did proceed with appropriate nonoperative measures, but the patient did continue to have ongoing pain.  Given her ongoing pain and dysfunction, we did discuss proceeding with the procedure reflected above.  The patient was fully aware of the risks and limitations of surgery and did elect to proceed.  OPERATIVE DETAILS:  On September 24, 2016, the patient was brought to surgery and general endotracheal anesthesia was administered.  The patient was placed prone on a well-padded flat Jackson bed with a spinal frame. Antibiotics were given.  The back was prepped and draped and a time-out procedure was performed.  A midline incision was made overlying the L5- S1 intervertebral space.  The fascia was incised in a curvilinear fashion just to the right of the midline.  A self-retaining retractor was placed.  I then removed the medial and  inferior aspect of the L5 lamina.  The ligamentum flavum was identified and resected.  The dura and the traversing right S1 nerve were identified.  The right S1 nerve was medially retracted.  Immediately ventral to the nerve was noted to be a protruded disk fragment.  I did use a micro nerve hook to tease away the superficial layers overlying the L5-S1 intervertebral disk.  In doing so, a very large fragment of disk did readily declare itself. This was removed uneventfully in 3 rather large fragments.  I did explore the remainder of the annular defect, and there were no additional intervertebral disk fragments encountered.  I was very pleased with the decompression of the nerve.  The wound was then copiously irrigated.  All bleeding was controlled using bipolar electrocautery, in addition to Surgiflo.  Depo-Medrol 20 mg was then introduced about the epidural space.  The wound was then closed in layers using #1 Vicryl, followed by 2-0 Vicryl, followed by 4-0 Monocryl.  Benzoin and Steri-Strips were applied, followed by sterile dressing.  All instrument counts were correct at the termination of the procedure.  Of note, Pricilla Holm, PA-C, was my assistant throughout surgery, and did aid in retraction, suctioning, and closure from start to finish.     Phylliss Bob, MD     MD/MEDQ  D:  09/24/2016  T:  09/24/2016  Job:  [id number]

## 2016-11-04 ENCOUNTER — Other Ambulatory Visit: Payer: Self-pay | Admitting: Orthopedic Surgery

## 2016-11-04 DIAGNOSIS — M5416 Radiculopathy, lumbar region: Secondary | ICD-10-CM

## 2016-11-16 ENCOUNTER — Ambulatory Visit
Admission: RE | Admit: 2016-11-16 | Discharge: 2016-11-16 | Disposition: A | Discharge status: Home / Self Care | Payer: Medicaid Other | Source: Ambulatory Visit | Attending: Orthopedic Surgery | Admitting: Orthopedic Surgery

## 2016-11-16 DIAGNOSIS — M5416 Radiculopathy, lumbar region: Secondary | ICD-10-CM

## 2016-11-16 MED ORDER — GADOBENATE DIMEGLUMINE 529 MG/ML IV SOLN
20.0000 mL | Freq: Once | INTRAVENOUS | Status: AC | PRN
  Administered 2016-11-16: 20 mL via INTRAVENOUS

## 2017-02-12 DIAGNOSIS — J00 Acute nasopharyngitis [common cold]: Secondary | ICD-10-CM | POA: Diagnosis not present

## 2017-04-02 DIAGNOSIS — M5416 Radiculopathy, lumbar region: Secondary | ICD-10-CM | POA: Diagnosis not present

## 2017-06-16 ENCOUNTER — Other Ambulatory Visit: Payer: Self-pay | Admitting: Orthopedic Surgery

## 2017-06-16 DIAGNOSIS — M5416 Radiculopathy, lumbar region: Secondary | ICD-10-CM

## 2017-06-30 ENCOUNTER — Ambulatory Visit
Admission: RE | Admit: 2017-06-30 | Discharge: 2017-06-30 | Disposition: A | Discharge status: Home / Self Care | Payer: Medicaid Other | Source: Ambulatory Visit | Attending: Orthopedic Surgery | Admitting: Orthopedic Surgery

## 2017-06-30 DIAGNOSIS — M5416 Radiculopathy, lumbar region: Secondary | ICD-10-CM

## 2017-07-22 ENCOUNTER — Encounter (HOSPITAL_COMMUNITY): Payer: Self-pay

## 2017-07-22 ENCOUNTER — Other Ambulatory Visit: Payer: Self-pay

## 2017-07-22 ENCOUNTER — Emergency Department (HOSPITAL_COMMUNITY): Payer: Medicaid Other

## 2017-07-22 ENCOUNTER — Emergency Department (HOSPITAL_COMMUNITY)
Admission: EM | Admit: 2017-07-22 | Discharge: 2017-07-22 | Disposition: A | Discharge status: Home / Self Care | Payer: Medicaid Other | Attending: Emergency Medicine | Admitting: Emergency Medicine

## 2017-07-22 DIAGNOSIS — Z8542 Personal history of malignant neoplasm of other parts of uterus: Secondary | ICD-10-CM | POA: Insufficient documentation

## 2017-07-22 DIAGNOSIS — O99331 Smoking (tobacco) complicating pregnancy, first trimester: Secondary | ICD-10-CM | POA: Insufficient documentation

## 2017-07-22 DIAGNOSIS — F17210 Nicotine dependence, cigarettes, uncomplicated: Secondary | ICD-10-CM | POA: Diagnosis not present

## 2017-07-22 DIAGNOSIS — Z79899 Other long term (current) drug therapy: Secondary | ICD-10-CM | POA: Diagnosis not present

## 2017-07-22 DIAGNOSIS — Z3A00 Weeks of gestation of pregnancy not specified: Secondary | ICD-10-CM | POA: Diagnosis not present

## 2017-07-22 DIAGNOSIS — O039 Complete or unspecified spontaneous abortion without complication: Secondary | ICD-10-CM | POA: Diagnosis not present

## 2017-07-22 LAB — CBC WITH DIFFERENTIAL/PLATELET
Basophils Absolute: 0 10*3/uL (ref 0.0–0.1)
Basophils Relative: 0 %
EOS ABS: 0.1 10*3/uL (ref 0.0–0.7)
EOS PCT: 1 %
HCT: 32.2 % — ABNORMAL LOW (ref 36.0–46.0)
Hemoglobin: 10.4 g/dL — ABNORMAL LOW (ref 12.0–15.0)
LYMPHS PCT: 32 %
Lymphs Abs: 2.6 10*3/uL (ref 0.7–4.0)
MCH: 28.4 pg (ref 26.0–34.0)
MCHC: 32.3 g/dL (ref 30.0–36.0)
MCV: 88 fL (ref 78.0–100.0)
MONO ABS: 0.4 10*3/uL (ref 0.1–1.0)
Monocytes Relative: 5 %
Neutro Abs: 5 10*3/uL (ref 1.7–7.7)
Neutrophils Relative %: 62 %
PLATELETS: 307 10*3/uL (ref 150–400)
RBC: 3.66 MIL/uL — ABNORMAL LOW (ref 3.87–5.11)
RDW: 13.5 % (ref 11.5–15.5)
WBC: 8.1 10*3/uL (ref 4.0–10.5)

## 2017-07-22 LAB — BASIC METABOLIC PANEL
Anion gap: 12 (ref 5–15)
BUN: 24 mg/dL — AB (ref 6–20)
CALCIUM: 8.9 mg/dL (ref 8.9–10.3)
CO2: 24 mmol/L (ref 22–32)
Chloride: 102 mmol/L (ref 101–111)
Creatinine, Ser: 0.61 mg/dL (ref 0.44–1.00)
GFR calc Af Amer: >60 mL/min (ref >60)
GLUCOSE: 99 mg/dL (ref 65–99)
Potassium: 3.6 mmol/L (ref 3.5–5.1)
SODIUM: 138 mmol/L (ref 135–145)

## 2017-07-22 LAB — TYPE AND SCREEN
ABO/RH(D): A POS
Antibody Screen: NEGATIVE

## 2017-07-22 LAB — I-STAT BETA HCG BLOOD, ED (MC, WL, AP ONLY): I-stat hCG, quantitative: 47.3 m[IU]/mL — ABNORMAL HIGH (ref <5)

## 2017-07-22 MED ORDER — HYDROMORPHONE HCL 1 MG/ML IJ SOLN
1.0000 mg | Freq: Once | INTRAMUSCULAR | Status: AC
  Administered 2017-07-22: 1 mg via INTRAVENOUS
  Filled 2017-07-22: qty 1

## 2017-07-22 MED ORDER — OXYCODONE-ACETAMINOPHEN 5-325 MG PO TABS
1.0000 | ORAL_TABLET | Freq: Once | ORAL | Status: AC
  Administered 2017-07-22: 1 via ORAL
  Filled 2017-07-22: qty 1

## 2017-07-22 MED ORDER — SODIUM CHLORIDE 0.9 % IV BOLUS
1000.0000 mL | Freq: Once | INTRAVENOUS | Status: AC
  Administered 2017-07-22: 1000 mL via INTRAVENOUS

## 2017-07-22 MED ORDER — METHYLERGONOVINE MALEATE 0.2 MG PO TABS: 0.2000 mg | ORAL_TABLET | Freq: Three times a day (TID) | ORAL | 0 refills | Status: AC

## 2017-07-22 MED ORDER — ONDANSETRON HCL 4 MG/2ML IJ SOLN
4.0000 mg | Freq: Once | INTRAMUSCULAR | Status: AC
  Administered 2017-07-22: 4 mg via INTRAVENOUS
  Filled 2017-07-22: qty 2

## 2017-07-22 NOTE — ED Provider Notes (Addendum)
[place] DEPT Provider Note   CSN: [id number] Arrival date & time: 07/22/17  0559     History   Chief Complaint Chief Complaint  Patient presents with  . Miscarriage    HPI Holly Whitehead is a 37 y.o. female G7, P0 here for evaluation of vaginal bleeding onset yesterday   Around 5 PM.  States she had a positive pregnancy, routine ultrasound at 8 weeks found no fetal heart rate.  Discussion with OB GYN and patient decided to watch and wait and observe for passage of tissue-pregnancy.  States she was driving last night when she had sudden onset labor type pain, these gradually worsened.  She coughed and felt tissue coming out while she was driving.  Since she has been having intermittent sharp crampy type lower abdominal pain, 8-10/10.  Has felt more tired but denies any lightheadedness, chest pain, shortness of breath.  She has had 6 vaginal deliveries and feels like today she is in labor again.  LMP 2/5, expected delivery date 10/10. No interventions PTA. No alleviating or aggravating factors.   HPI  Past Medical History:  Diagnosis Date  . Anemia   . Anxiety   . Headache    migraines  . Sciatica   . Single artery and vein of umbilical cord affecting care of newborn 11/08/2014  . SVD (spontaneous vaginal delivery) 11/08/2014  . Uterine cancer [facility])     Patient Active Problem List   Diagnosis Date Noted  . Pregnant 11/08/2014  . Single artery and vein of umbilical cord affecting care of newborn 11/08/2014  . SVD (spontaneous vaginal delivery) 11/08/2014    Past Surgical History:  Procedure Laterality Date  . LUMBAR LAMINECTOMY/DECOMPRESSION MICRODISCECTOMY Right 09/24/2016   Procedure: RIGHT SIDED LUMBAR 5-SACRUM 1 MICRODISCECTOMY;  Surgeon: Phylliss Bob, MD;  Location: [place];  Service: Orthopedics;  Laterality: Right;  RIGHT SIDED LUMBAR 5-SACRUM 1 MICRODISCECTOMY  . NO PAST SURGERIES       OB History    Gravida  4   Para  3   Term  3     Preterm  0   AB  1   Living  1     SAB  1   TAB  0   Ectopic  0   Multiple  0   Live Births  1            Home Medications    Prior to Admission medications   Medication Sig Start Date End Date Taking? Authorizing Provider  Biotin 10 MG TABS Take 10 mg by mouth daily.   Yes [provider]  cyclobenzaprine (FLEXERIL) 5 MG tablet Take 5-10 mg by mouth at bedtime as needed for muscle spasms.  06/07/17  Yes [provider]  cycloSPORINE (RESTASIS) 0.05 % ophthalmic emulsion Place 1 drop into both eyes 2 (two) times daily.   Yes [provider]  gabapentin (NEURONTIN) 100 MG capsule Take 100 mg by mouth 2 (two) times daily.   Yes [provider]  ibuprofen (ADVIL,MOTRIN) 200 MG tablet Take 800 mg by mouth every 8 (eight) hours as needed for moderate pain.   Yes [provider]  Prenatal Vit-Fe Fumarate-FA (PRENATAL MULTIVITAMIN) TABS tablet Take 1 tablet by mouth daily at 12 noon.   Yes [provider]  traMADol (ULTRAM) 50 MG tablet Take 50-100 mg by mouth every 6 (six) hours as needed for moderate pain.  07/20/17  Yes [provider]  vitamin E 400 UNIT capsule  Take 400 Units by mouth daily. Pt bursts capsules and applies topically over scars/stretch marks   Yes [provider]  methylergonovine (METHERGINE) 0.2 MG tablet Take 1 tablet (0.2 mg total) by mouth 3 (three) times daily for 3 days. 07/22/17 07/25/17  Kinnie Feil, PA-C    Family History Family History  Problem Relation Age of Onset  . Diabetes Maternal Grandmother     Social History Social History   Tobacco Use  . Smoking status: Current Every Day Smoker    Packs/day: 0.25    Types: Cigarettes  . Smokeless tobacco: Current User    Last attempt to quit: 10/20/2006  Substance Use Topics  . Alcohol use: No  . Drug use: No     Allergies   Bee venom and Adhesive [tape]   Review of Systems Review of Systems  Gastrointestinal:  Positive for nausea.  Genitourinary: Positive for pelvic pain and vaginal bleeding.     Physical Exam Updated Vital Signs BP 123/82   Pulse 97   Temp 98 F (36.7 C) (Oral)   Resp 17   Ht 5\' 6"  (1.676 m)   Wt 102.5 kg (226 lb)   SpO2 100%   BMI 36.48 kg/m   Physical Exam  Constitutional: She is oriented to person, place, and time. She appears well-developed and well-nourished. No distress.  Looks uncomfortable  HENT:  Head: Normocephalic and atraumatic.  Nose: Nose normal.  Moist mucous membranes   Eyes: Pupils are equal, round, and reactive to light. Conjunctivae and EOM are normal.  Neck: Normal range of motion.  Cardiovascular: Normal rate, regular rhythm and intact distal pulses.  2+ DP and radial pulses bilaterally. No LE edema.   Pulmonary/Chest: Effort normal and breath sounds normal.  Abdominal: Soft. Bowel sounds are normal. There is tenderness.  Diffuse lower abdominal tenderness, no guarding,   No rigidity, no rebound.  Active bowel sounds.  No CVA tenderness.  Genitourinary:  Genitourinary Comments: External genitalia normal without erythema, edema, tenderness, discharge or lesions.  No groin lymphadenopathy.  Difficult to visualize cervix due to clots and bleeding however palpated cervix with bimanual exam.  \ Clots and tissue noted.  Uterus tender. Non palpable adnexa.  Musculoskeletal: Normal range of motion.  Neurological: She is alert and oriented to person, place, and time.  Skin: Skin is warm and dry. Capillary refill takes less than 2 seconds.  Psychiatric: She has a normal mood and affect. Her behavior is normal.  Nursing note and vitals reviewed.    ED Treatments / Results  Labs (all labs ordered are listed, but only abnormal results are displayed) Labs Reviewed  CBC WITH DIFFERENTIAL/PLATELET - Abnormal; Notable for the following components:      Result Value   RBC 3.66 (*)    Hemoglobin 10.4 (*)    HCT 32.2 (*)    All other components  within normal limits  BASIC METABOLIC PANEL - Abnormal; Notable for the following components:   BUN 24 (*)    All other components within normal limits  I-STAT BETA HCG BLOOD, ED ([facility], [facility], AP ONLY) - Abnormal; Notable for the following components:   I-stat hCG, quantitative 47.3 (*)    All other components within normal limits  TYPE AND SCREEN    EKG None  Radiology US Ob Comp < 14 Wks  Result Date: 07/22/2017 CLINICAL DATA:  Vaginal bleeding EXAM: OBSTETRIC <14 WK [place] AND TRANSVAGINAL OB US TECHNIQUE: Both transabdominal and transvaginal ultrasound examinations were performed for complete  evaluation of the gestation as well as the maternal uterus, adnexal regions, and pelvic cul-de-sac. Transvaginal technique was performed to assess early pregnancy. COMPARISON:  None. FINDINGS: Intrauterine gestational sac: None Yolk sac:  Not visualized Embryo:  Not visualized Cardiac Activity: Heart Rate:   bpm MSD:   mm    w     d CRL:    mm    w    d                  [place] EDC: Subchorionic hemorrhage:  None visualized. Maternal uterus/adnexae: The endometrium measures 6 mm in thickness. There is distention of the endometrial canal with a large amount of fluid extending into the cervix. No free fluid in the pelvis. No adnexal mass. IMPRESSION: No intrauterine gestation visualized. Distention of the endometrial canal with a large amount of fluid. Electronically Signed   By: Rolm Baptise M.D.   On: 07/22/2017 10:47   US Ob Transvaginal  Result Date: 07/22/2017 CLINICAL DATA:  Vaginal bleeding EXAM: OBSTETRIC <14 WK [place] AND TRANSVAGINAL OB US TECHNIQUE: Both transabdominal and transvaginal ultrasound examinations were performed for complete evaluation of the gestation as well as the maternal uterus, adnexal regions, and pelvic cul-de-sac. Transvaginal technique was performed to assess early pregnancy. COMPARISON:  None. FINDINGS: Intrauterine gestational sac: None Yolk sac:  Not visualized Embryo:  Not visualized  Cardiac Activity: Heart Rate:   bpm MSD:   mm    w     d CRL:    mm    w    d                  [place] EDC: Subchorionic hemorrhage:  None visualized. Maternal uterus/adnexae: The endometrium measures 6 mm in thickness. There is distention of the endometrial canal with a large amount of fluid extending into the cervix. No free fluid in the pelvis. No adnexal mass. IMPRESSION: No intrauterine gestation visualized. Distention of the endometrial canal with a large amount of fluid. Electronically Signed   By: Rolm Baptise M.D.   On: 07/22/2017 10:47    Procedures Procedures (including critical care time)  Medications Ordered in ED Medications  oxyCODONE-acetaminophen (PERCOCET/ROXICET) 5-325 MG per tablet 1 tablet (has no administration in time range)  HYDROmorphone (DILAUDID) injection 1 mg (1 mg Intravenous Given 07/22/17 0740)  ondansetron (ZOFRAN) injection 4 mg (4 mg Intravenous Given 07/22/17 0740)  sodium chloride 0.9 % bolus 1,000 mL (0 mLs Intravenous Stopped 07/22/17 0927)  HYDROmorphone (DILAUDID) injection 1 mg (1 mg Intravenous Given 07/22/17 1008)     Initial Impression / Assessment and Plan / ED Course  I have reviewed the triage vital signs and the nursing notes.  Pertinent labs & imaging results that were available during my care of the patient were reviewed by me and considered in my medical decision making (see chart for details).  Clinical Course as of Jul 22 1124  Thu Jul 22, 2017  0905 Hemoglobin(!): 10.4 [CG]  1000 Spoke to Dr Melba Coon who agrees with management thus far, call back with [place] results. Reordered dilaudid. [place] pending. VSS   [CG]  1117 Maternal uterus/adnexae: The endometrium measures 6 mm in thickness. There is distention of the endometrial canal with a large amount of fluid extending into the cervix. No free fluid in the pelvis. No adnexal mass.  IMPRESSION: No intrauterine gestation visualized. Distention of the endometrial canal with a large amount of fluid.    US OB Transvaginal [CG]  1120  I spoke to Dr. Melba Coon who has reviewed ultrasound.  Findings reassuring.  I reevaluated patient, states her cramping has significantly decreased, now more intermittent.  In the last 1 hour bleeding has decreased.  Dr. Melba Coon recommending [place] and appropriate for [employer].    [CG]    Clinical Course User Index [CG] Kinnie Feil, PA-C   Ddx includes inevitable miscarriage. Will obtain screenings labs. Pelvic exam, IVF, analgesia, zofran pending. Anticipate need for [place].   Final Clinical Impressions(s) / ED Diagnoses   Labs and imaging reviewed.  Remarkable for hemoglobin 10.4, no recent ones to compare.  Ultrasound shows no retained products of conception, large amount of fluid in pelvis.  Patient has been HD stable with VSS.  Reevaluated her and pain and bleeding have improved.  I spoke to [facility] GYN who deems patient appropriate for discharge with Methergine.  Follow-up as needed and to 3 weeks.  Updated patient on results and plan to discharge and she is in agreement. Final diagnoses:  Complete miscarriage    ED Discharge Orders        Ordered    methylergonovine (METHERGINE) 0.2 MG tablet  3 times daily     07/22/17 1126       Kinnie Feil, [place] 07/22/17 1125    Kinnie Feil, PA-C 07/22/17 1126    Malvin Johns, MD 07/22/17 1514

## 2017-07-22 NOTE — ED Notes (Signed)
Ultrasound at bedside

## 2017-07-22 NOTE — ED Triage Notes (Signed)
Pt states she knew at 8 weeks she had a ultrasound and no heart beat and yesterday had large amount of bleeding with tissue expelled in her car and this morning more bleeding and cramping voiced.

## 2017-07-22 NOTE — Discharge Instructions (Signed)
Based on ultrasound, miscarriage has been completed.  Dr. Melba Coon  Dr Melba Coon is recommending discharge with medication to help push out remaining clots.  Take 600 mg of ibuprofen and +1000 mg of acetaminophen (Tylenol) every 6-8 hours for associated pain.  You should rest until the pain starts to improve.  Go to [facility] at any time if you develop shortness of breath, chest pain, heavy vaginal bleeding, worsening pain.

## 2017-09-15 DIAGNOSIS — M533 Sacrococcygeal disorders, not elsewhere classified: Secondary | ICD-10-CM | POA: Diagnosis not present

## 2017-10-08 DIAGNOSIS — M47816 Spondylosis without myelopathy or radiculopathy, lumbar region: Secondary | ICD-10-CM | POA: Diagnosis not present

## 2017-11-01 DIAGNOSIS — H40033 Anatomical narrow angle, bilateral: Secondary | ICD-10-CM | POA: Diagnosis not present

## 2017-11-01 DIAGNOSIS — H1013 Acute atopic conjunctivitis, bilateral: Secondary | ICD-10-CM | POA: Diagnosis not present

## 2017-11-02 DIAGNOSIS — H5213 Myopia, bilateral: Secondary | ICD-10-CM | POA: Diagnosis not present

## 2017-12-01 DIAGNOSIS — H16223 Keratoconjunctivitis sicca, not specified as Sjogren's, bilateral: Secondary | ICD-10-CM | POA: Diagnosis not present

## 2018-01-04 DIAGNOSIS — H5203 Hypermetropia, bilateral: Secondary | ICD-10-CM | POA: Diagnosis not present

## 2018-01-04 DIAGNOSIS — H1013 Acute atopic conjunctivitis, bilateral: Secondary | ICD-10-CM | POA: Diagnosis not present

## 2018-02-14 DIAGNOSIS — K0501 Acute gingivitis, non-plaque induced: Secondary | ICD-10-CM | POA: Diagnosis not present

## 2018-03-02 NOTE — L&D Delivery Note (Signed)
DELIVERY NOTE  Pt complete and at +2 station with urge to push. Epidural controlling pain. Pt pushed and delivered a viable female infant in LOA position. Anterior and posterior shoulders spontaneously delivered with next two pushes; body easily followed next. Infant placed on mothers abdomen and bulb suction of mouth and nose performed. Cord was then clamped and cut by MD. Cord blood obtained, 3VC. Baby had a vigorous spontaneous cry noted. Placenta then delivered at 0223 intact. Fundal massage performedl. Fundus firm however intermittent LUS atony noted. IM hemabate, PV cytotec (1014mcg) and 2nd bag of pitocin ordered and administered. The following lacerations were noted: NONE.  Mother and baby stable. Counts correct   Infant time: 0218 Gender: female Placenta time: 0223 Apgars: 9/9 Weight: pending skin-to-skin  Continue MgSO4 24hrs pp, Foley in place.

## 2018-05-24 DIAGNOSIS — Z3A01 Less than 8 weeks gestation of pregnancy: Secondary | ICD-10-CM | POA: Diagnosis not present

## 2018-05-24 DIAGNOSIS — O3680X0 Pregnancy with inconclusive fetal viability, not applicable or unspecified: Secondary | ICD-10-CM | POA: Diagnosis not present

## 2018-06-06 DIAGNOSIS — O26891 Other specified pregnancy related conditions, first trimester: Secondary | ICD-10-CM | POA: Diagnosis not present

## 2018-06-06 DIAGNOSIS — Z3A01 Less than 8 weeks gestation of pregnancy: Secondary | ICD-10-CM | POA: Diagnosis not present

## 2018-07-07 DIAGNOSIS — Z3689 Encounter for other specified antenatal screening: Secondary | ICD-10-CM | POA: Diagnosis not present

## 2018-07-07 DIAGNOSIS — K219 Gastro-esophageal reflux disease without esophagitis: Secondary | ICD-10-CM | POA: Diagnosis not present

## 2018-07-07 DIAGNOSIS — O99211 Obesity complicating pregnancy, first trimester: Secondary | ICD-10-CM | POA: Diagnosis not present

## 2018-07-07 DIAGNOSIS — Z3A11 11 weeks gestation of pregnancy: Secondary | ICD-10-CM | POA: Diagnosis not present

## 2018-07-07 DIAGNOSIS — O09521 Supervision of elderly multigravida, first trimester: Secondary | ICD-10-CM | POA: Diagnosis not present

## 2018-07-07 DIAGNOSIS — O09522 Supervision of elderly multigravida, second trimester: Secondary | ICD-10-CM | POA: Diagnosis not present

## 2018-07-07 DIAGNOSIS — Z368A Encounter for antenatal screening for other genetic defects: Secondary | ICD-10-CM | POA: Diagnosis not present

## 2018-07-07 DIAGNOSIS — Z113 Encounter for screening for infections with a predominantly sexual mode of transmission: Secondary | ICD-10-CM | POA: Diagnosis not present

## 2018-07-07 DIAGNOSIS — M545 Low back pain: Secondary | ICD-10-CM | POA: Diagnosis not present

## 2018-07-07 LAB — OB RESULTS CONSOLE GC/CHLAMYDIA
Chlamydia: NEGATIVE
Gonorrhea: NEGATIVE

## 2018-07-07 LAB — OB RESULTS CONSOLE HIV ANTIBODY (ROUTINE TESTING): HIV: NONREACTIVE

## 2018-07-07 LAB — OB RESULTS CONSOLE HEPATITIS B SURFACE ANTIGEN: Hepatitis B Surface Ag: NEGATIVE

## 2018-07-07 LAB — OB RESULTS CONSOLE VARICELLA ZOSTER ANTIBODY, IGG: Varicella: IMMUNE

## 2018-07-07 LAB — OB RESULTS CONSOLE RUBELLA ANTIBODY, IGM: Rubella: IMMUNE

## 2018-07-15 DIAGNOSIS — O09522 Supervision of elderly multigravida, second trimester: Secondary | ICD-10-CM | POA: Diagnosis not present

## 2018-07-15 DIAGNOSIS — O09512 Supervision of elderly primigravida, second trimester: Secondary | ICD-10-CM | POA: Diagnosis not present

## 2018-07-15 DIAGNOSIS — O09521 Supervision of elderly multigravida, first trimester: Secondary | ICD-10-CM | POA: Diagnosis not present

## 2018-07-15 DIAGNOSIS — Z3682 Encounter for antenatal screening for nuchal translucency: Secondary | ICD-10-CM | POA: Diagnosis not present

## 2018-07-15 DIAGNOSIS — O99211 Obesity complicating pregnancy, first trimester: Secondary | ICD-10-CM | POA: Diagnosis not present

## 2018-07-15 DIAGNOSIS — O09511 Supervision of elderly primigravida, first trimester: Secondary | ICD-10-CM | POA: Diagnosis not present

## 2018-07-15 DIAGNOSIS — Z3A12 12 weeks gestation of pregnancy: Secondary | ICD-10-CM | POA: Diagnosis not present

## 2018-08-31 DIAGNOSIS — O09512 Supervision of elderly primigravida, second trimester: Secondary | ICD-10-CM | POA: Diagnosis not present

## 2018-08-31 DIAGNOSIS — G44209 Tension-type headache, unspecified, not intractable: Secondary | ICD-10-CM | POA: Diagnosis not present

## 2018-08-31 DIAGNOSIS — Z3A19 19 weeks gestation of pregnancy: Secondary | ICD-10-CM | POA: Diagnosis not present

## 2018-08-31 DIAGNOSIS — O99212 Obesity complicating pregnancy, second trimester: Secondary | ICD-10-CM | POA: Diagnosis not present

## 2018-08-31 DIAGNOSIS — Z3689 Encounter for other specified antenatal screening: Secondary | ICD-10-CM | POA: Diagnosis not present

## 2018-09-09 DIAGNOSIS — Z362 Encounter for other antenatal screening follow-up: Secondary | ICD-10-CM | POA: Diagnosis not present

## 2018-09-09 DIAGNOSIS — O99212 Obesity complicating pregnancy, second trimester: Secondary | ICD-10-CM | POA: Diagnosis not present

## 2018-09-09 DIAGNOSIS — Z3A20 20 weeks gestation of pregnancy: Secondary | ICD-10-CM | POA: Diagnosis not present

## 2018-09-09 DIAGNOSIS — O09512 Supervision of elderly primigravida, second trimester: Secondary | ICD-10-CM | POA: Diagnosis not present

## 2018-10-24 DIAGNOSIS — Z3A27 27 weeks gestation of pregnancy: Secondary | ICD-10-CM | POA: Diagnosis not present

## 2018-10-24 DIAGNOSIS — O09292 Supervision of pregnancy with other poor reproductive or obstetric history, second trimester: Secondary | ICD-10-CM | POA: Diagnosis not present

## 2018-10-24 DIAGNOSIS — Z23 Encounter for immunization: Secondary | ICD-10-CM | POA: Diagnosis not present

## 2018-10-24 DIAGNOSIS — Z3689 Encounter for other specified antenatal screening: Secondary | ICD-10-CM | POA: Diagnosis not present

## 2018-10-24 DIAGNOSIS — F5109 Other insomnia not due to a substance or known physiological condition: Secondary | ICD-10-CM | POA: Diagnosis not present

## 2018-10-27 DIAGNOSIS — O99810 Abnormal glucose complicating pregnancy: Secondary | ICD-10-CM | POA: Diagnosis not present

## 2018-11-25 DIAGNOSIS — R319 Hematuria, unspecified: Secondary | ICD-10-CM | POA: Diagnosis not present

## 2018-11-25 DIAGNOSIS — Z3A31 31 weeks gestation of pregnancy: Secondary | ICD-10-CM | POA: Diagnosis not present

## 2018-11-25 DIAGNOSIS — O4403 Placenta previa specified as without hemorrhage, third trimester: Secondary | ICD-10-CM | POA: Diagnosis not present

## 2018-12-09 DIAGNOSIS — O99213 Obesity complicating pregnancy, third trimester: Secondary | ICD-10-CM | POA: Diagnosis not present

## 2018-12-09 DIAGNOSIS — Z3A33 33 weeks gestation of pregnancy: Secondary | ICD-10-CM | POA: Diagnosis not present

## 2018-12-09 DIAGNOSIS — O3663X0 Maternal care for excessive fetal growth, third trimester, not applicable or unspecified: Secondary | ICD-10-CM | POA: Diagnosis not present

## 2018-12-26 DIAGNOSIS — O479 False labor, unspecified: Secondary | ICD-10-CM | POA: Diagnosis not present

## 2018-12-26 DIAGNOSIS — Z3A36 36 weeks gestation of pregnancy: Secondary | ICD-10-CM | POA: Diagnosis not present

## 2018-12-26 DIAGNOSIS — Z3685 Encounter for antenatal screening for Streptococcus B: Secondary | ICD-10-CM | POA: Diagnosis not present

## 2018-12-26 LAB — OB RESULTS CONSOLE GBS: GBS: NEGATIVE

## 2019-01-02 ENCOUNTER — Inpatient Hospital Stay (HOSPITAL_COMMUNITY): Payer: Medicaid Other | Admitting: Anesthesiology

## 2019-01-02 ENCOUNTER — Encounter (HOSPITAL_COMMUNITY): Payer: Self-pay

## 2019-01-02 ENCOUNTER — Telehealth (HOSPITAL_COMMUNITY): Payer: Self-pay

## 2019-01-02 ENCOUNTER — Other Ambulatory Visit (HOSPITAL_COMMUNITY)
Admission: RE | Admit: 2019-01-02 | Discharge: 2019-01-02 | Disposition: A | Discharge status: Home / Self Care | Payer: Medicaid Other | Source: Ambulatory Visit | Attending: Obstetrics and Gynecology | Admitting: Obstetrics and Gynecology

## 2019-01-02 ENCOUNTER — Inpatient Hospital Stay (HOSPITAL_COMMUNITY)
Admission: AD | Admit: 2019-01-02 | Discharge: 2019-01-05 | DRG: 807 | Disposition: A | Discharge status: Home / Self Care | Payer: Medicaid Other | Attending: Obstetrics and Gynecology | Admitting: Obstetrics and Gynecology

## 2019-01-02 ENCOUNTER — Other Ambulatory Visit: Payer: Self-pay

## 2019-01-02 DIAGNOSIS — O9902 Anemia complicating childbirth: Secondary | ICD-10-CM | POA: Diagnosis present

## 2019-01-02 DIAGNOSIS — Z3A37 37 weeks gestation of pregnancy: Secondary | ICD-10-CM

## 2019-01-02 DIAGNOSIS — O99214 Obesity complicating childbirth: Secondary | ICD-10-CM | POA: Diagnosis present

## 2019-01-02 DIAGNOSIS — Z87891 Personal history of nicotine dependence: Secondary | ICD-10-CM | POA: Diagnosis not present

## 2019-01-02 DIAGNOSIS — K219 Gastro-esophageal reflux disease without esophagitis: Secondary | ICD-10-CM | POA: Diagnosis present

## 2019-01-02 DIAGNOSIS — O1414 Severe pre-eclampsia complicating childbirth: Principal | ICD-10-CM | POA: Diagnosis present

## 2019-01-02 DIAGNOSIS — O9962 Diseases of the digestive system complicating childbirth: Secondary | ICD-10-CM | POA: Diagnosis present

## 2019-01-02 DIAGNOSIS — O1494 Unspecified pre-eclampsia, complicating childbirth: Secondary | ICD-10-CM | POA: Diagnosis not present

## 2019-01-02 DIAGNOSIS — D649 Anemia, unspecified: Secondary | ICD-10-CM | POA: Diagnosis present

## 2019-01-02 DIAGNOSIS — Z20828 Contact with and (suspected) exposure to other viral communicable diseases: Secondary | ICD-10-CM | POA: Diagnosis present

## 2019-01-02 DIAGNOSIS — O139 Gestational [pregnancy-induced] hypertension without significant proteinuria, unspecified trimester: Secondary | ICD-10-CM | POA: Diagnosis not present

## 2019-01-02 DIAGNOSIS — O1413 Severe pre-eclampsia, third trimester: Secondary | ICD-10-CM

## 2019-01-02 DIAGNOSIS — O133 Gestational [pregnancy-induced] hypertension without significant proteinuria, third trimester: Secondary | ICD-10-CM | POA: Diagnosis not present

## 2019-01-02 DIAGNOSIS — O164 Unspecified maternal hypertension, complicating childbirth: Secondary | ICD-10-CM | POA: Diagnosis not present

## 2019-01-02 LAB — TYPE AND SCREEN
ABO/RH(D): A POS
Antibody Screen: NEGATIVE

## 2019-01-02 LAB — CBC
HCT: 31.8 % — ABNORMAL LOW (ref 36.0–46.0)
Hemoglobin: 10.3 g/dL — ABNORMAL LOW (ref 12.0–15.0)
MCH: 29.5 pg (ref 26.0–34.0)
MCHC: 32.4 g/dL (ref 30.0–36.0)
MCV: 91.1 fL (ref 80.0–100.0)
Platelets: 238 10*3/uL (ref 150–400)
RBC: 3.49 MIL/uL — ABNORMAL LOW (ref 3.87–5.11)
RDW: 13.5 % (ref 11.5–15.5)
WBC: 8.6 10*3/uL (ref 4.0–10.5)
nRBC: 0 % (ref 0.0–0.2)

## 2019-01-02 LAB — SARS CORONAVIRUS 2 (TAT 6-24 HRS): SARS Coronavirus 2: NEGATIVE

## 2019-01-02 MED ORDER — LABETALOL HCL 5 MG/ML IV SOLN: 40.0000 mg | INTRAVENOUS | Status: DC | PRN

## 2019-01-02 MED ORDER — LIDOCAINE HCL (PF) 1 % IJ SOLN: 30.0000 mL | INTRAMUSCULAR | Status: DC | PRN

## 2019-01-02 MED ORDER — DIPHENHYDRAMINE HCL 50 MG/ML IJ SOLN: 12.5000 mg | INTRAMUSCULAR | Status: DC | PRN

## 2019-01-02 MED ORDER — OXYCODONE-ACETAMINOPHEN 5-325 MG PO TABS: 2.0000 | ORAL_TABLET | ORAL | Status: DC | PRN

## 2019-01-02 MED ORDER — FENTANYL-BUPIVACAINE-NACL 0.5-0.125-0.9 MG/250ML-% EP SOLN
12.0000 mL/h | EPIDURAL | Status: DC | PRN
  Filled 2019-01-02: qty 250

## 2019-01-02 MED ORDER — PHENYLEPHRINE 40 MCG/ML (10ML) SYRINGE FOR IV PUSH (FOR BLOOD PRESSURE SUPPORT): 80.0000 ug | PREFILLED_SYRINGE | INTRAVENOUS | Status: DC | PRN

## 2019-01-02 MED ORDER — FLEET ENEMA 7-19 GM/118ML RE ENEM: 1.0000 | ENEMA | RECTAL | Status: DC | PRN

## 2019-01-02 MED ORDER — LACTATED RINGERS IV SOLN: 500.0000 mL | INTRAVENOUS | Status: DC | PRN

## 2019-01-02 MED ORDER — EPHEDRINE 5 MG/ML INJ: 10.0000 mg | INTRAVENOUS | Status: DC | PRN

## 2019-01-02 MED ORDER — OXYTOCIN 40 UNITS IN NORMAL SALINE INFUSION - SIMPLE MED: 2.5000 [IU]/h | INTRAVENOUS | Status: DC

## 2019-01-02 MED ORDER — NIFEDIPINE 10 MG PO CAPS: 10.0000 mg | ORAL_CAPSULE | ORAL | Status: DC | PRN

## 2019-01-02 MED ORDER — MAGNESIUM SULFATE BOLUS VIA INFUSION
4.0000 g | Freq: Once | INTRAVENOUS | Status: AC
  Administered 2019-01-02: 19:00:00 4 g via INTRAVENOUS
  Filled 2019-01-02: qty 1000

## 2019-01-02 MED ORDER — SODIUM CHLORIDE (PF) 0.9 % IJ SOLN
INTRAMUSCULAR | Status: DC | PRN
  Administered 2019-01-02: 12 mL/h via EPIDURAL

## 2019-01-02 MED ORDER — OXYCODONE-ACETAMINOPHEN 5-325 MG PO TABS: 1.0000 | ORAL_TABLET | ORAL | Status: DC | PRN

## 2019-01-02 MED ORDER — TERBUTALINE SULFATE 1 MG/ML IJ SOLN: 0.2500 mg | Freq: Once | INTRAMUSCULAR | Status: DC | PRN

## 2019-01-02 MED ORDER — ONDANSETRON HCL 4 MG/2ML IJ SOLN
4.0000 mg | Freq: Four times a day (QID) | INTRAMUSCULAR | Status: DC | PRN
  Administered 2019-01-03: 03:00:00 4 mg via INTRAVENOUS
  Filled 2019-01-02: qty 2

## 2019-01-02 MED ORDER — MAGNESIUM SULFATE 40 GM/1000ML IV SOLN
2.0000 g/h | INTRAVENOUS | Status: DC
  Filled 2019-01-02: qty 1000

## 2019-01-02 MED ORDER — SOD CITRATE-CITRIC ACID 500-334 MG/5ML PO SOLN
30.0000 mL | ORAL | Status: DC | PRN
  Administered 2019-01-02: 22:00:00 30 mL via ORAL
  Filled 2019-01-02: qty 30

## 2019-01-02 MED ORDER — NIFEDIPINE 10 MG PO CAPS: 20.0000 mg | ORAL_CAPSULE | ORAL | Status: DC | PRN

## 2019-01-02 MED ORDER — LACTATED RINGERS IV SOLN
500.0000 mL | Freq: Once | INTRAVENOUS | Status: AC
  Administered 2019-01-02: 20:00:00 500 mL via INTRAVENOUS

## 2019-01-02 MED ORDER — FAMOTIDINE IN NACL 20-0.9 MG/50ML-% IV SOLN
20.0000 mg | Freq: Once | INTRAVENOUS | Status: AC
  Administered 2019-01-02: 23:00:00 20 mg via INTRAVENOUS
  Filled 2019-01-02: qty 50

## 2019-01-02 MED ORDER — OXYTOCIN 40 UNITS IN NORMAL SALINE INFUSION - SIMPLE MED
1.0000 m[IU]/min | INTRAVENOUS | Status: DC
  Administered 2019-01-02: 20:00:00 2 m[IU]/min via INTRAVENOUS
  Filled 2019-01-02: qty 1000

## 2019-01-02 MED ORDER — ACETAMINOPHEN 325 MG PO TABS: 650.0000 mg | ORAL_TABLET | ORAL | Status: DC | PRN

## 2019-01-02 MED ORDER — OXYTOCIN BOLUS FROM INFUSION
500.0000 mL | Freq: Once | INTRAVENOUS | Status: AC
  Administered 2019-01-03: 02:00:00 500 mL via INTRAVENOUS

## 2019-01-02 MED ORDER — LACTATED RINGERS IV SOLN
INTRAVENOUS | Status: DC
  Administered 2019-01-02: 18:00:00 via INTRAVENOUS

## 2019-01-02 MED ORDER — LIDOCAINE HCL (PF) 1 % IJ SOLN
INTRAMUSCULAR | Status: DC | PRN
  Administered 2019-01-02: 10 mL via EPIDURAL

## 2019-01-02 NOTE — Progress Notes (Signed)
Clear AROM @ 2108, 4/70/-3. Cat 1 tracing, pit at 2, continue to titrate up. TOCO q6 EFW 3450g, pelvis adequate

## 2019-01-02 NOTE — H&P (Signed)
Virga Koelsch is a 38 y.o. female presenting for IOL for PreE w/ SF being elevated LFTs. Have elevated Bps 140s, 150s in office today. Stat labs drawn showed 10.1/29.7/219, Cr 0.48, AP 197, AST/ALT 38/77, uPC 0.61. NST reactive in office, CE 3-4/70/-3. Pt w/ occ ctxx, +FM, denies VB, LOF  PNC s/f Depression (counseling no meds), GERD (esomeprazoel 40mg  DR), posterior placenta previa resolved at 32wks, BMI 46, AMA w/ neg Panorama, Vitamin D deficiency, anemia  Past Pregnancies 02-21-1999, 40 wks 1. F, 8lbs 9oz, Vaginal Delivery  04-17-2002, 40 wks 1. M, 8lbs, Vaginal Delivery  04-04-2003, 40 wks 1. M, 7lbs 15oz, Vaginal Delivery  01-10-2006, 40 wks 1. F, 7lbs 15oz, Vaginal Delivery  06-04-2010, 40 wks 1. F, 8lbs, Vaginal Delivery  11-08-2014, 40.3 wks 1. M, 9lbs 4oz, Vaginal Delivery  05-31-2017 - SAB  OB History    Gravida  8   Para  6   Term  6   Preterm  0   AB  1   Living  6     SAB  1   TAB  0   Ectopic  0   Multiple  0   Live Births  3          Past Medical History:  Diagnosis Date  . Acid reflux   . Anemia   . Anxiety   . Chicken pox    as a child  . Constipation   . Headache    migraines  . Sciatica   . Single artery and vein of umbilical cord affecting care of newborn 11/08/2014  . SVD (spontaneous vaginal delivery) 11/08/2014  . Uterine cancer [facility])    Past Surgical History:  Procedure Laterality Date  . LUMBAR LAMINECTOMY/DECOMPRESSION MICRODISCECTOMY Right 09/24/2016   Procedure: RIGHT SIDED LUMBAR 5-SACRUM 1 MICRODISCECTOMY;  Surgeon: Phylliss Bob, MD;  Location: [place];  Service: Orthopedics;  Laterality: Right;  RIGHT SIDED LUMBAR 5-SACRUM 1 MICRODISCECTOMY   Family History: family history includes Breast cancer in her half-sister; Cancer in her paternal grandfather; Diabetes in her maternal grandmother; Leukemia in her paternal grandfather; Uterine cancer in her half-sister. Social History:  reports that she has quit smoking. Her  smoking use included cigarettes. She smoked 0.25 packs per day. She quit smokeless tobacco use about 12 years ago. She reports that she does not drink alcohol or use drugs.     Maternal Diabetes: No (1hr 194, 3hr WNL - 92/176/145/118) Genetic Screening: Normal Maternal Ultrasounds/Referrals: Normal Fetal Ultrasounds or other Referrals:  None Maternal Substance Abuse:  No Significant Maternal Medications:  Meds include: Nexium Significant Maternal Lab Results:  Group B Strep negative Other Comments:  None  Review of Systems  Constitutional: Negative for chills and fever.  Eyes: Positive for blurred vision.  Respiratory: Negative for shortness of breath.   Cardiovascular: Negative for chest pain, palpitations and leg swelling.  Gastrointestinal: Negative for abdominal pain, nausea and vomiting.  Neurological: Positive for headaches. Negative for dizziness and weakness.  Psychiatric/Behavioral: Negative for suicidal ideas.   Maternal Medical History:  Reason for admission: Nausea.      unknown if currently breastfeeding. Exam Physical Exam  Constitutional: She is oriented to person, place, and time. She appears well-developed and well-nourished. No distress.  HENT:  Head: Normocephalic and atraumatic.  Eyes: Pupils are equal, round, and reactive to light.  Neck: Normal range of motion. Neck supple.  Cardiovascular: Normal rate and regular rhythm. Exam reveals no gallop.  No murmur heard. GI: There is no abdominal  tenderness. There is no rebound and no guarding.  Genitourinary:    Vagina and uterus normal.   Musculoskeletal: Normal range of motion.  Neurological: She is alert and oriented to person, place, and time.  Skin: Skin is warm and dry.    Prenatal labs: ABO, Rh:  Apos Antibody:  neg Rubella: Immune (05/07 0000) RPR:   nr HBsAg: Negative (05/07 0000)  HIV: Non-reactive (05/07 0000)  GBS: Negative/-- (10/26 0000)   Assessment/Plan: This is a 38yo [id number] @ 13  2/7 vy 7 2/7 TVUS admitted for IOL for PreE w/ SF (LFTs, HA).   -PreE w/ SF: labs as above in HPI    *4g bolus, 2g maintenance    *Nifedipine protocol for emergent -CLD, CEFM, desire epidural, no circ for baby boy -Satisfied fertility, papers signed late, aware of need for interval   Deliah Boston 01/02/2019, 6:28 PM

## 2019-01-02 NOTE — Progress Notes (Signed)
Labor Note  S: starting occ painful ctxs. +HA but lessened overall, denies other PreE symptoms.   O: BP 123/78   Pulse (!) 112   Temp 98.6 F (37 C) (Oral)   Resp 18   Ht 5\' 5"  (1.651 m)   Wt 123.4 kg   SpO2 98%   BMI 45.26 kg/m  CE: 4/70/-3 FHR: Baseline 135, +accels, -decels, modvariability TOCO q7, pitocin at 68mU/min  A/P: This is a 38 y.o. [id number] at [redacted]w[redacted]d  admitted for IOL for PreE w/ SF (LFTs, HA) FWB: Cat 1 MWB: Desires epidural, labs WNL Labor course: Latent. Plan for epidural and AROM, continue to titrate pitocin    *Labs from office immediately prior to admission 10.1/29.7/219, Cr 0.48, AP 197, AST/ALT 38/77, uPC 0.61.    *Continue MgSO4 for seizure ppx  Anticipate SVD

## 2019-01-02 NOTE — MAU Note (Signed)
Asymptomatic, swab collected. 

## 2019-01-02 NOTE — Anesthesia Procedure Notes (Signed)
Epidural Patient location during procedure: OB Start time: 01/02/2019 8:28 PM End time: 01/02/2019 8:37 PM  Staffing Anesthesiologist: Lidia Collum, MD Performed: anesthesiologist   Preanesthetic Checklist Completed: patient identified, pre-op evaluation, timeout performed, IV checked, risks and benefits discussed and monitors and equipment checked  Epidural Patient position: sitting Prep: DuraPrep Patient monitoring: heart rate, continuous pulse ox and blood pressure Approach: midline Location: L3-L4 Injection technique: LOR air  Needle:  Needle type: Tuohy  Needle gauge: 17 G Needle length: 9 cm Needle insertion depth: 6 cm Catheter type: closed end flexible Catheter size: 19 Gauge Catheter at skin depth: 11 cm Test dose: negative  Assessment Events: blood not aspirated, injection not painful, no injection resistance, negative IV test and no paresthesia  Additional Notes Reason for block:procedure for pain

## 2019-01-02 NOTE — Telephone Encounter (Signed)
Preadmission screen  

## 2019-01-02 NOTE — Anesthesia Preprocedure Evaluation (Signed)
Anesthesia Evaluation  Patient identified by MRN, date of birth, ID band Patient awake    Reviewed: Allergy & Precautions, H&P , NPO status , Patient's Chart, lab work & pertinent test results  History of Anesthesia Complications Negative for: history of anesthetic complications  Airway Mallampati: II  TM Distance: >3 FB Neck ROM: full    Dental no notable dental hx.    Pulmonary neg pulmonary ROS, Patient abstained from smoking., former smoker,    Pulmonary exam normal        Cardiovascular hypertension, Normal cardiovascular exam Rhythm:regular Rate:Normal     Neuro/Psych negative neurological ROS  negative psych ROS   GI/Hepatic Neg liver ROS, GERD  ,  Endo/Other  Morbid obesity  Renal/GU negative Renal ROS  negative genitourinary   Musculoskeletal   Abdominal   Peds  Hematology  (+) Blood dyscrasia, anemia ,   Anesthesia Other Findings Pre-eclampsia on mag  Reproductive/Obstetrics (+) Pregnancy                             Anesthesia Physical Anesthesia Plan  ASA: III  Anesthesia Plan: Epidural   Post-op Pain Management:    Induction:   PONV Risk Score and Plan:   Airway Management Planned:   Additional Equipment:   Intra-op Plan:   Post-operative Plan:   Informed Consent: I have reviewed the patients History and Physical, chart, labs and discussed the procedure including the risks, benefits and alternatives for the proposed anesthesia with the patient or authorized representative who has indicated his/her understanding and acceptance.       Plan Discussed with:   Anesthesia Plan Comments:         Anesthesia Quick Evaluation

## 2019-01-03 ENCOUNTER — Encounter (HOSPITAL_COMMUNITY): Payer: Self-pay

## 2019-01-03 ENCOUNTER — Inpatient Hospital Stay (HOSPITAL_COMMUNITY)
Admission: AD | Admit: 2019-01-03 | Payer: Medicaid Other | Source: Home / Self Care | Admitting: Obstetrics and Gynecology

## 2019-01-03 ENCOUNTER — Inpatient Hospital Stay (HOSPITAL_COMMUNITY): Payer: Medicaid Other

## 2019-01-03 LAB — CBC
HCT: 30.9 % — ABNORMAL LOW (ref 36.0–46.0)
HCT: 27.6 % — ABNORMAL LOW (ref 36.0–46.0)
Hemoglobin: 9.9 g/dL — ABNORMAL LOW (ref 12.0–15.0)
Hemoglobin: 8.9 g/dL — ABNORMAL LOW (ref 12.0–15.0)
MCH: 29.8 pg (ref 26.0–34.0)
MCH: 29.5 pg (ref 26.0–34.0)
MCHC: 32 g/dL (ref 30.0–36.0)
MCHC: 32.2 g/dL (ref 30.0–36.0)
MCV: 93.1 fL (ref 80.0–100.0)
MCV: 91.4 fL (ref 80.0–100.0)
Platelets: 240 10*3/uL (ref 150–400)
Platelets: 201 10*3/uL (ref 150–400)
RBC: 3.32 MIL/uL — ABNORMAL LOW (ref 3.87–5.11)
RBC: 3.02 MIL/uL — ABNORMAL LOW (ref 3.87–5.11)
RDW: 13.5 % (ref 11.5–15.5)
RDW: 13.5 % (ref 11.5–15.5)
WBC: 12 10*3/uL — ABNORMAL HIGH (ref 4.0–10.5)
WBC: 12.8 10*3/uL — ABNORMAL HIGH (ref 4.0–10.5)
nRBC: 0 % (ref 0.0–0.2)
nRBC: 0 % (ref 0.0–0.2)

## 2019-01-03 LAB — COMPREHENSIVE METABOLIC PANEL
ALT: 68 U/L — ABNORMAL HIGH (ref 0–44)
AST: 39 U/L (ref 15–41)
Albumin: 2.1 g/dL — ABNORMAL LOW (ref 3.5–5.0)
Alkaline Phosphatase: 141 U/L — ABNORMAL HIGH (ref 38–126)
Anion gap: 7 (ref 5–15)
BUN: 5 mg/dL — ABNORMAL LOW (ref 6–20)
CO2: 22 mmol/L (ref 22–32)
Calcium: 7.3 mg/dL — ABNORMAL LOW (ref 8.9–10.3)
Chloride: 105 mmol/L (ref 98–111)
Creatinine, Ser: 0.51 mg/dL (ref 0.44–1.00)
GFR calc Af Amer: >60 mL/min (ref >60)
GFR calc non Af Amer: >60 mL/min (ref >60)
Glucose, Bld: 124 mg/dL — ABNORMAL HIGH (ref 70–99)
Potassium: 3.4 mmol/L — ABNORMAL LOW (ref 3.5–5.1)
Sodium: 134 mmol/L — ABNORMAL LOW (ref 135–145)
Total Bilirubin: 0.5 mg/dL (ref 0.3–1.2)
Total Protein: 5.1 g/dL — ABNORMAL LOW (ref 6.5–8.1)

## 2019-01-03 LAB — RPR: RPR Ser Ql: NONREACTIVE

## 2019-01-03 MED ORDER — IBUPROFEN 600 MG PO TABS
600.0000 mg | ORAL_TABLET | Freq: Four times a day (QID) | ORAL | Status: DC
  Administered 2019-01-03 – 2019-01-05 (×9): 600 mg via ORAL
  Filled 2019-01-03 (×10): qty 1

## 2019-01-03 MED ORDER — PRENATAL MULTIVITAMIN CH
1.0000 | ORAL_TABLET | Freq: Every day | ORAL | Status: DC
  Administered 2019-01-03 – 2019-01-04 (×2): 1 via ORAL
  Filled 2019-01-03 (×2): qty 1

## 2019-01-03 MED ORDER — ONDANSETRON HCL 4 MG/2ML IJ SOLN: 4.0000 mg | INTRAMUSCULAR | Status: DC | PRN

## 2019-01-03 MED ORDER — TETANUS-DIPHTH-ACELL PERTUSSIS 5-2.5-18.5 LF-MCG/0.5 IM SUSP: 0.5000 mL | Freq: Once | INTRAMUSCULAR | Status: DC

## 2019-01-03 MED ORDER — DOCUSATE SODIUM 100 MG PO CAPS
100.0000 mg | ORAL_CAPSULE | Freq: Two times a day (BID) | ORAL | Status: DC
  Administered 2019-01-03 – 2019-01-05 (×4): 100 mg via ORAL
  Filled 2019-01-03 (×4): qty 1

## 2019-01-03 MED ORDER — SIMETHICONE 80 MG PO CHEW: 80.0000 mg | CHEWABLE_TABLET | ORAL | Status: DC | PRN

## 2019-01-03 MED ORDER — NIFEDIPINE 10 MG PO CAPS: 10.0000 mg | ORAL_CAPSULE | ORAL | Status: DC | PRN

## 2019-01-03 MED ORDER — DIPHENHYDRAMINE HCL 25 MG PO CAPS: 25.0000 mg | ORAL_CAPSULE | Freq: Four times a day (QID) | ORAL | Status: DC | PRN

## 2019-01-03 MED ORDER — LACTATED RINGERS IV SOLN
INTRAVENOUS | Status: DC
  Administered 2019-01-03 (×2): via INTRAVENOUS

## 2019-01-03 MED ORDER — OXYCODONE HCL 5 MG PO TABS
5.0000 mg | ORAL_TABLET | ORAL | Status: DC | PRN
  Administered 2019-01-03 (×2): 10 mg via ORAL
  Administered 2019-01-03: 5 mg via ORAL
  Administered 2019-01-03 – 2019-01-04 (×6): 10 mg via ORAL
  Administered 2019-01-05: 05:00:00 5 mg via ORAL
  Administered 2019-01-05 (×2): 10 mg via ORAL
  Filled 2019-01-03: qty 2
  Filled 2019-01-03: qty 1
  Filled 2019-01-03 (×4): qty 2
  Filled 2019-01-03: qty 1
  Filled 2019-01-03 (×6): qty 2

## 2019-01-03 MED ORDER — DIBUCAINE (PERIANAL) 1 % EX OINT: 1.0000 "application " | TOPICAL_OINTMENT | CUTANEOUS | Status: DC | PRN

## 2019-01-03 MED ORDER — NIFEDIPINE 10 MG PO CAPS: 20.0000 mg | ORAL_CAPSULE | ORAL | Status: DC | PRN

## 2019-01-03 MED ORDER — ONDANSETRON HCL 4 MG PO TABS: 4.0000 mg | ORAL_TABLET | ORAL | Status: DC | PRN

## 2019-01-03 MED ORDER — CARBOPROST TROMETHAMINE 250 MCG/ML IM SOLN
INTRAMUSCULAR | Status: AC
  Filled 2019-01-03: qty 1

## 2019-01-03 MED ORDER — COCONUT OIL OIL
1.0000 "application " | TOPICAL_OIL | Status: DC | PRN
  Administered 2019-01-03: 1 via TOPICAL

## 2019-01-03 MED ORDER — MAGNESIUM SULFATE 40 GM/1000ML IV SOLN
2.0000 g/h | INTRAVENOUS | Status: AC
  Administered 2019-01-03: 13:00:00 2 g/h via INTRAVENOUS
  Filled 2019-01-03: qty 1000

## 2019-01-03 MED ORDER — MISOPROSTOL 200 MCG PO TABS
ORAL_TABLET | ORAL | Status: AC
  Filled 2019-01-03: qty 5

## 2019-01-03 MED ORDER — MISOPROSTOL 200 MCG PO TABS
1000.0000 ug | ORAL_TABLET | Freq: Once | ORAL | Status: AC
  Administered 2019-01-03: 03:00:00 1000 ug via RECTAL

## 2019-01-03 MED ORDER — ZOLPIDEM TARTRATE 5 MG PO TABS: 5.0000 mg | ORAL_TABLET | Freq: Every evening | ORAL | Status: DC | PRN

## 2019-01-03 MED ORDER — CARBOPROST TROMETHAMINE 250 MCG/ML IM SOLN
250.0000 ug | Freq: Once | INTRAMUSCULAR | Status: AC
  Administered 2019-01-03: 250 ug via INTRAMUSCULAR

## 2019-01-03 MED ORDER — DIPHENOXYLATE-ATROPINE 2.5-0.025 MG PO TABS: 2.0000 | ORAL_TABLET | Freq: Four times a day (QID) | ORAL | Status: DC | PRN

## 2019-01-03 MED ORDER — LABETALOL HCL 5 MG/ML IV SOLN: 40.0000 mg | INTRAVENOUS | Status: DC | PRN

## 2019-01-03 MED ORDER — ACETAMINOPHEN 325 MG PO TABS
650.0000 mg | ORAL_TABLET | ORAL | Status: DC | PRN
  Administered 2019-01-03 – 2019-01-04 (×5): 650 mg via ORAL
  Filled 2019-01-03 (×6): qty 2

## 2019-01-03 MED ORDER — WITCH HAZEL-GLYCERIN EX PADS: 1.0000 "application " | MEDICATED_PAD | CUTANEOUS | Status: DC | PRN

## 2019-01-03 MED ORDER — BENZOCAINE-MENTHOL 20-0.5 % EX AERO
1.0000 "application " | INHALATION_SPRAY | CUTANEOUS | Status: DC | PRN
  Administered 2019-01-03: 1 via TOPICAL
  Filled 2019-01-03: qty 56

## 2019-01-03 MED ORDER — SENNOSIDES-DOCUSATE SODIUM 8.6-50 MG PO TABS
2.0000 | ORAL_TABLET | ORAL | Status: DC
  Administered 2019-01-03 – 2019-01-04 (×2): 2 via ORAL
  Filled 2019-01-03 (×2): qty 2

## 2019-01-03 MED ORDER — DIPHENOXYLATE-ATROPINE 2.5-0.025 MG PO TABS
2.0000 | ORAL_TABLET | Freq: Once | ORAL | Status: AC
  Administered 2019-01-03: 03:00:00 2 via ORAL
  Filled 2019-01-03: qty 2

## 2019-01-03 MED ORDER — MISOPROSTOL 200 MCG PO TABS: 1000.0000 ug | ORAL_TABLET | Freq: Once | ORAL | Status: DC

## 2019-01-03 NOTE — Progress Notes (Addendum)
Post Partum Day 0 -  PreE w severe features (elevated LFTs) Subjective: no complaints, up ad lib, voiding, tolerating PO and nl lochia, pain controlled.  On Magnesium Sulfate for Sz prophylaxis,  Follow LFTs, more elevated this AM.  Severe cramping this AM.  D/w pt G7P7 and fast delivery.    Objective: Blood pressure (!) 114/52, pulse 88, temperature 97.9 F (36.6 C), resp. rate 18, height 5\' 5"  (1.651 m), weight 123.4 kg, SpO2 100 %, unknown if currently breastfeeding.  Physical Exam:  General: alert and no distress Lochia: appropriate Uterine Fundus: firm Incision: healing well DVT Evaluation: No evidence of DVT seen on physical exam.  Recent Labs    01/03/19 0323 01/03/19 0542  HGB 9.9* 8.9*  HCT 30.9* 27.6*    Assessment/Plan: Routine PP care.  Mg off at 2am 11/4.  Monitor uop and LFTs.   Breastfeeding and Lactation consult.  Close monitoring.  Baby doing well.     LOS: 1 day   Piotr Christopher Bovard-Stuckert 01/03/2019, 8:57 AM

## 2019-01-03 NOTE — Anesthesia Postprocedure Evaluation (Signed)
Anesthesia Post Note  Patient: Holly Whitehead  Procedure(s) Performed: AN AD HOC LABOR EPIDURAL     Patient location during evaluation: Mother Baby Anesthesia Type: Epidural Level of consciousness: awake and alert and oriented Pain management: satisfactory to patient Vital Signs Assessment: post-procedure vital signs reviewed and stable Respiratory status: spontaneous breathing and nonlabored ventilation Cardiovascular status: stable Postop Assessment: no headache, no backache, no signs of nausea or vomiting, adequate PO intake, patient able to bend at knees and able to ambulate (patient up walking) Anesthetic complications: no    Last Vitals:  Vitals:   01/03/19 0857 01/03/19 0900  BP: 120/63   Pulse: 89   Resp: 17 17  Temp: 36.7 C   SpO2: 98%     Last Pain:  Vitals:   01/03/19 0924  TempSrc:   PainSc: 8    Pain Goal: Patients Stated Pain Goal: 3 (01/03/19 0438)                 Willa Rough

## 2019-01-03 NOTE — Lactation Note (Signed)
Lactation Consultation Note  Patient Name: Holly Whitehead [id number] Date: [id number]  Mom states she desires to exclusively formula feed.   Maternal Data    Feeding    LATCH Score                   Interventions    Lactation Tools Discussed/Used     Consult Status      Ave Filter 01/03/2019, 8:49 AM

## 2019-01-04 LAB — COMPREHENSIVE METABOLIC PANEL
ALT: 47 U/L — ABNORMAL HIGH (ref 0–44)
AST: 26 U/L (ref 15–41)
Albumin: 2.1 g/dL — ABNORMAL LOW (ref 3.5–5.0)
Alkaline Phosphatase: 132 U/L — ABNORMAL HIGH (ref 38–126)
Anion gap: 9 (ref 5–15)
BUN: 6 mg/dL (ref 6–20)
CO2: 24 mmol/L (ref 22–32)
Calcium: 7.1 mg/dL — ABNORMAL LOW (ref 8.9–10.3)
Chloride: 103 mmol/L (ref 98–111)
Creatinine, Ser: 0.57 mg/dL (ref 0.44–1.00)
GFR calc Af Amer: >60 mL/min (ref >60)
GFR calc non Af Amer: >60 mL/min (ref >60)
Glucose, Bld: 110 mg/dL — ABNORMAL HIGH (ref 70–99)
Potassium: 3.7 mmol/L (ref 3.5–5.1)
Sodium: 136 mmol/L (ref 135–145)
Total Bilirubin: 0.2 mg/dL — ABNORMAL LOW (ref 0.3–1.2)
Total Protein: 5.1 g/dL — ABNORMAL LOW (ref 6.5–8.1)

## 2019-01-04 LAB — CBC
HCT: 24.6 % — ABNORMAL LOW (ref 36.0–46.0)
Hemoglobin: 7.9 g/dL — ABNORMAL LOW (ref 12.0–15.0)
MCH: 29.5 pg (ref 26.0–34.0)
MCHC: 32.1 g/dL (ref 30.0–36.0)
MCV: 91.8 fL (ref 80.0–100.0)
Platelets: 200 10*3/uL (ref 150–400)
RBC: 2.68 MIL/uL — ABNORMAL LOW (ref 3.87–5.11)
RDW: 13.6 % (ref 11.5–15.5)
WBC: 6.9 10*3/uL (ref 4.0–10.5)
nRBC: 0 % (ref 0.0–0.2)

## 2019-01-04 NOTE — Progress Notes (Signed)
CSW received consult for hx of Anxiety. Per prenatal records review, MOB also has a history of depressive disorder.  CSW met with MOB to offer support and complete assessment.    CSW met with MOB at bedside to discuss consult for history of anxiety/depressive disorder, FOB present. CSW asked FOB to leave the room to speak with MOB privately, FOB seemed pleasant and left the room voluntarily. CSW introduced self and explained reason for consult. MOB was welcoming, pleasant, talkative and engaged during assessment. CSW and MOB discussed MOB's mental health history. MOB endorsed a history of anxiety around age 41 or 38 years old. MOB reported that she experienced panic attacks that consisted of hyperventilating, being shaky/jittery and increased heart rate. MOB reported that her panic attacks were triggered by having too much caffeine. MOB reported that she was prescribed xanax in the past and her last panic attack was six years ago. MOB denied any current anxiety symptoms. MOB denied any other mental health history. CSW inquired about depression and postpartum depression, MOB denied history of depression and postpartum depression. CSW inquired how MOB was feeling emotionally after giving birth, MOB reported that she felt "pretty good" because she was ready to have the baby due to pain and discomfort. MOB presented pleasant and talkative. MOB did not demonstrate any acute mental health signs/symptoms. CSW assessed for safety, MOB denied SI, HI and domestic violence.   CSW provided education regarding the baby blues period vs. perinatal mood disorders, discussed treatment and gave resources for mental health follow up if concerns arise.  CSW recommends self-evaluation during the postpartum time period using the New Mom Checklist from Postpartum Progress and encouraged MOB to contact a medical professional if symptoms are noted at any time.    CSW provided review of Sudden Infant Death Syndrome (SIDS) precautions.  MOB verbalized a strong understanding of SIDS and reported that infant would sleep in his basinet.   MOB reported that she has some items for infant including a car seat and basinet. MOB reported that she didn't think infant would come early and had not bought a lot of items. MOB reported that her sister in law recently had a baby and brought her some stuff. CSW inquired if MOB was interested in a baby bundle, MOB reported yes. CSW agreed to provide. CSW inquired about MOB's support system, MOB reported that her mother in law and sister in law are good supports and help with the kids.   CSW provided MOB with a baby bundle. MOB appreciative and thanked CSW.   CSW identifies no further need for intervention and no barriers to discharge at this time.  Abundio Miu, [place] Worker [facility] Cell#: [phone]

## 2019-01-04 NOTE — Progress Notes (Signed)
Post Partum Day 1 Subjective: up ad lib, voiding, tolerating PO, + flatus and lochia mild. She reports cramping - oxycodone helping pain. She is no longer breastfeeding ; bottlefeeding instead. Bonding well with baby. Denies CP/SOB/HA.   Objective: Blood pressure 124/67, pulse 80, temperature 98.4 F (36.9 C), temperature source Oral, resp. rate 18, height 5\' 5"  (1.651 m), weight 123.4 kg, SpO2 99 %, unknown if currently breastfeeding.  Physical Exam:  General: alert, cooperative and no distress Lochia: appropriate Uterine Fundus: firm Incision: n/a DVT Evaluation: No evidence of DVT seen on physical exam. Calf/Ankle edema is present.  Recent Labs    01/03/19 0542 01/04/19 0547  HGB 8.9* 7.9*  HCT 27.6* 24.6*   LFTS improving  Assessment/Plan: Plan for discharge tomorrow  Anemia noted; pt asymptomatic LFTs improving and BP stable on no medication- monitor  Routine pp care; possible discharge home tomorrow    LOS: 2 days   Holly Whitehead W Holly Whitehead 01/04/2019, 9:22 AM

## 2019-01-05 MED ORDER — OXYCODONE HCL 5 MG PO TABS: 5.0000 mg | ORAL_TABLET | ORAL | 0 refills | Status: DC | PRN

## 2019-01-05 MED ORDER — MEDROXYPROGESTERONE ACETATE 150 MG/ML IM SUSP
150.0000 mg | Freq: Once | INTRAMUSCULAR | Status: AC
  Administered 2019-01-05: 09:00:00 150 mg via INTRAMUSCULAR
  Filled 2019-01-05: qty 1

## 2019-01-05 MED ORDER — IBUPROFEN 600 MG PO TABS: 600.0000 mg | ORAL_TABLET | Freq: Four times a day (QID) | ORAL | 0 refills | Status: DC

## 2019-01-05 NOTE — Discharge Summary (Signed)
OB Discharge Summary     Patient Name: Holly Whitehead DOB: [id number] MRN: [id number]  Date of admission: 01/02/2019 Delivering MD: Eula Flax M   Date of discharge: 01/05/2019  Admitting diagnosis: Pregnancy Intrauterine pregnancy: [redacted]w[redacted]d     Secondary diagnosis:  Active Problems:   Preeclampsia, severe, third trimester   [redacted] weeks gestation of pregnancy  Additional problems: anemia, asymptomatic     Discharge diagnosis: Term Pregnancy Delivered                                                                                                Post partum procedures:none  Augmentation: AROM and Pitocin  Complications: excess postpartum bleeding due to atony  Hospital course:  Induction of Labor With Vaginal Delivery   38 y.o. yo [id number] at [redacted]w[redacted]d was admitted to the hospital 01/02/2019 for induction of labor.  Indication for induction: Preeclampsia.  Patient had an uncomplicated labor course as follows: Membrane Rupture Time/Date: 9:08 PM ,01/02/2019   Intrapartum Procedures: Episiotomy: None [1]                                         Lacerations:  None [1]  Patient had delivery of a Viable infant.  Information for the patient's newborn:  Aubryella, Hoose [id number]  Delivery Method: Vag-Spont    01/03/2019  Details of delivery can be found in separate delivery note.  Patient had a  postpartum course significant for magnesium x 24 hours for seizure prophylaxis. Patient is discharged home 01/05/19 with normal BP's on no medications.  LFT's normalizing.  She was ambulating fine and asymptomatic from her anemia.  She requested and received Depo provera at d/c for contraception.  Physical exam  Vitals:   01/05/19 0012 01/05/19 0402 01/05/19 0403 01/05/19 0733  BP: (!) 116/49 135/68 135/68 (!) 142/81  Pulse: 86 87 83 76  Resp:  18  19  Temp:  98.3 F (36.8 C)  98.7 F (37.1 C)  TempSrc:  Oral  Oral  SpO2:  99%  100%  Weight:      Height:       General: alert and  cooperative Lochia: appropriate Uterine Fundus: firm  Labs: Lab Results  Component Value Date   WBC 6.9 01/04/2019   HGB 7.9 (L) 01/04/2019   HCT 24.6 (L) 01/04/2019   MCV 91.8 01/04/2019   PLT 200 01/04/2019   CMP Latest Ref Rng & Units 01/04/2019  Glucose 70 - 99 mg/dL 110(H)  BUN 6 - 20 mg/dL 6  Creatinine 0.44 - 1.00 mg/dL 0.57  Sodium 135 - 145 mmol/L 136  Potassium 3.5 - 5.1 mmol/L 3.7  Chloride 98 - 111 mmol/L 103  CO2 22 - 32 mmol/L 24  Calcium 8.9 - 10.3 mg/dL 7.1(L)  Total Protein 6.5 - 8.1 g/dL 5.1(L)  Total Bilirubin 0.3 - 1.2 mg/dL 0.2(L)  Alkaline Phos 38 - 126 U/L 132(H)  AST 15 - 41 U/L 26  ALT 0 - 44 U/L 47(H)    Discharge  instruction: per After Visit Summary and "Baby and Me Booklet".  After visit meds:  Allergies as of 01/05/2019      Reactions   Bee Venom Swelling   Extreme swelling   Adhesive [tape] Rash      Medication List    STOP taking these medications   cyclobenzaprine 5 MG tablet Commonly known as: FLEXERIL   traMADol 50 MG tablet Commonly known as: ULTRAM     TAKE these medications   Biotin 10 MG Tabs Take 10 mg by mouth daily.   cycloSPORINE 0.05 % ophthalmic emulsion Commonly known as: RESTASIS Place 1 drop into both eyes 2 (two) times daily.   gabapentin 100 MG capsule Commonly known as: NEURONTIN Take 100 mg by mouth 2 (two) times daily.   ibuprofen 600 MG tablet Commonly known as: ADVIL Take 1 tablet (600 mg total) by mouth every 6 (six) hours. What changed:   medication strength  how much to take  when to take this  reasons to take this   oxyCODONE 5 MG immediate release tablet Commonly known as: Oxy IR/ROXICODONE Take 1-2 tablets (5-10 mg total) by mouth every 4 (four) hours as needed for severe pain.   prenatal multivitamin Tabs tablet Take 1 tablet by mouth daily at 12 noon.   vitamin E 400 UNIT capsule Take 400 Units by mouth daily. Pt bursts capsules and applies topically over scars/stretch marks        Diet: routine diet  Activity: Advance as tolerated. Pelvic rest for 6 weeks.   Outpatient follow up:4 days Follow up Appt:No future appointments. Follow up Visit:No follow-ups on file.  Postpartum contraception: Depo Provera  Newborn Data: Live born female  Birth Weight: 8 lb 5.3 oz (3779 g) APGAR: 9, 9  Newborn Delivery   Birth date/time: 01/03/2019 02:18:00 Delivery type: Vaginal, Spontaneous      Baby Feeding: Bottle Disposition:home with mother   01/05/2019 Logan Bores, MD

## 2019-01-05 NOTE — Progress Notes (Signed)
Discharge  Teaching complete

## 2019-01-05 NOTE — Progress Notes (Signed)
Post Partum Day 2 Subjective: no complaints, up ad lib and tolerating PO   Some cramping, but bleeding normal and ambulating without dizziness  Objective: Blood pressure (!) 142/81, pulse 76, temperature 98.7 F (37.1 C), temperature source Oral, resp. rate 19, height 5\' 5"  (1.651 m), weight 123.4 kg, SpO2 100 %, unknown if currently breastfeeding.  Physical Exam:  General: alert and cooperative Lochia: appropriate Uterine Fundus: firm   Recent Labs    01/03/19 0542 01/04/19 0547  HGB 8.9* 7.9*  HCT 27.6* 24.6*    Assessment/Plan: Discharge home  Tolerating relative anemia well BP WNL but will do BP check in office next week   LOS: 3 days   Logan Bores 01/05/2019, 8:33 AM

## 2019-01-09 ENCOUNTER — Inpatient Hospital Stay (HOSPITAL_BASED_OUTPATIENT_CLINIC_OR_DEPARTMENT_OTHER): Payer: Medicaid Other

## 2019-01-09 ENCOUNTER — Other Ambulatory Visit: Payer: Self-pay

## 2019-01-09 ENCOUNTER — Encounter (HOSPITAL_COMMUNITY): Payer: Self-pay | Admitting: *Deleted

## 2019-01-09 ENCOUNTER — Inpatient Hospital Stay (HOSPITAL_COMMUNITY)
Admission: AD | Admit: 2019-01-09 | Discharge: 2019-01-09 | Disposition: A | Discharge status: Home / Self Care | Payer: Medicaid Other | Attending: Obstetrics and Gynecology | Admitting: Obstetrics and Gynecology

## 2019-01-09 DIAGNOSIS — O1205 Gestational edema, complicating the puerperium: Secondary | ICD-10-CM | POA: Insufficient documentation

## 2019-01-09 DIAGNOSIS — R519 Headache, unspecified: Secondary | ICD-10-CM | POA: Diagnosis present

## 2019-01-09 DIAGNOSIS — Z87891 Personal history of nicotine dependence: Secondary | ICD-10-CM | POA: Insufficient documentation

## 2019-01-09 DIAGNOSIS — M549 Dorsalgia, unspecified: Secondary | ICD-10-CM | POA: Insufficient documentation

## 2019-01-09 DIAGNOSIS — M7989 Other specified soft tissue disorders: Secondary | ICD-10-CM

## 2019-01-09 DIAGNOSIS — O1490 Unspecified pre-eclampsia, unspecified trimester: Secondary | ICD-10-CM

## 2019-01-09 DIAGNOSIS — O9089 Other complications of the puerperium, not elsewhere classified: Secondary | ICD-10-CM | POA: Diagnosis not present

## 2019-01-09 HISTORY — DX: Gestational (pregnancy-induced) hypertension without significant proteinuria, unspecified trimester: O13.9

## 2019-01-09 LAB — COMPREHENSIVE METABOLIC PANEL
ALT: 36 U/L (ref 0–44)
AST: 21 U/L (ref 15–41)
Albumin: 2.6 g/dL — ABNORMAL LOW (ref 3.5–5.0)
Alkaline Phosphatase: 110 U/L (ref 38–126)
Anion gap: 8 (ref 5–15)
BUN: 14 mg/dL (ref 6–20)
CO2: 23 mmol/L (ref 22–32)
Calcium: 8.5 mg/dL — ABNORMAL LOW (ref 8.9–10.3)
Chloride: 108 mmol/L (ref 98–111)
Creatinine, Ser: 0.57 mg/dL (ref 0.44–1.00)
GFR calc Af Amer: >60 mL/min (ref >60)
GFR calc non Af Amer: >60 mL/min (ref >60)
Glucose, Bld: 94 mg/dL (ref 70–99)
Potassium: 4.1 mmol/L (ref 3.5–5.1)
Sodium: 139 mmol/L (ref 135–145)
Total Bilirubin: 0.1 mg/dL — ABNORMAL LOW (ref 0.3–1.2)
Total Protein: 6 g/dL — ABNORMAL LOW (ref 6.5–8.1)

## 2019-01-09 LAB — CBC
HCT: 28.3 % — ABNORMAL LOW (ref 36.0–46.0)
Hemoglobin: 8.9 g/dL — ABNORMAL LOW (ref 12.0–15.0)
MCH: 29.7 pg (ref 26.0–34.0)
MCHC: 31.4 g/dL (ref 30.0–36.0)
MCV: 94.3 fL (ref 80.0–100.0)
Platelets: 314 10*3/uL (ref 150–400)
RBC: 3 MIL/uL — ABNORMAL LOW (ref 3.87–5.11)
RDW: 13.9 % (ref 11.5–15.5)
WBC: 7 10*3/uL (ref 4.0–10.5)
nRBC: 0 % (ref 0.0–0.2)

## 2019-01-09 MED ORDER — OXYCODONE HCL 5 MG PO TABS
10.0000 mg | ORAL_TABLET | Freq: Once | ORAL | Status: AC
  Administered 2019-01-09: 10 mg via ORAL
  Filled 2019-01-09: qty 2

## 2019-01-09 NOTE — Discharge Instructions (Signed)

## 2019-01-09 NOTE — Progress Notes (Signed)
RLE venous duplex       has been completed. Preliminary results can be found under CV proc through chart review. Pierre Dellarocco, BS, RDMS, RVT   

## 2019-01-09 NOTE — MAU Provider Note (Signed)
History     CSN: [id number]  Arrival date and time: 01/09/19 1219   First Provider Initiated Contact with Patient 01/09/19 1258      Chief Complaint  Patient presents with  . Hypertension  . Leg Swelling  . Foot Swelling   HPI Holly Whitehead is a 38 y.o. [id number] postpartum patient who presents to MAU for evaluation of headache and right lower extremity swelling. She is s/p SVD on 01/03/19 and discharged from the hospital on 01/05/19. Patient was seen in clinic this morning and advised to "have labs run" in MAU today.    Visual Disturbances Patient endorses one episode of "squiggly lines" in her field of vision when she "looked at the walls" in MAU. She denies headache, RUQ/epigastric pain.  RLE Swelling Patient states her right light has been visibly more swollen than her left since the delivered on 01/03/19. She states it is improving over time with rest and elevation. She denies calf pain, discoloration, impaired functioning. She denies SOB, weakness, syncope.  Back Pain Patient endorses back pain 6/10 which radiates to her lower abdomen. She took pain medicine this morning around 0300, experienced relief, but is not out of the medication she was prescribed at hospital discharge.  OB History    Gravida  8   Para  7   Term  7   Preterm  0   AB  1   Living  7     SAB  1   TAB  0   Ectopic  0   Multiple  0   Live Births  4           Past Medical History:  Diagnosis Date  . Acid reflux   . Anemia   . Anxiety   . Chicken pox    as a child  . Constipation   . Headache    migraines  . Pregnancy induced hypertension   . Sciatica   . Single artery and vein of umbilical cord affecting care of newborn 11/08/2014  . SVD (spontaneous vaginal delivery) 11/08/2014  . Uterine cancer [facility])     Past Surgical History:  Procedure Laterality Date  . LUMBAR LAMINECTOMY/DECOMPRESSION MICRODISCECTOMY Right 09/24/2016   Procedure: RIGHT SIDED LUMBAR 5-SACRUM 1  MICRODISCECTOMY;  Surgeon: Phylliss Bob, MD;  Location: [place];  Service: Orthopedics;  Laterality: Right;  RIGHT SIDED LUMBAR 5-SACRUM 1 MICRODISCECTOMY    Family History  Problem Relation Age of Onset  . Diabetes Maternal Grandmother   . Healthy Father   . Breast cancer Half-Sister   . Uterine cancer Half-Sister   . Cancer Paternal Grandfather   . Leukemia Paternal Grandfather   . Healthy Mother     Social History   Tobacco Use  . Smoking status: Former Smoker    Packs/day: 0.25    Types: Cigarettes  . Smokeless tobacco: Former Systems developer    Quit date: 10/20/2006  Substance Use Topics  . Alcohol use: No  . Drug use: No    Allergies:  Allergies  Allergen Reactions  . Bee Venom Swelling    Extreme swelling  . Adhesive [Tape] Rash    Medications Prior to Admission  Medication Sig Dispense Refill Last Dose  . docusate sodium (COLACE) 100 MG capsule Take 100 mg by mouth 2 (two) times daily.   01/08/2019 at Unknown time  . ibuprofen (ADVIL) 600 MG tablet Take 1 tablet (600 mg total) by mouth every 6 (six) hours. 30 tablet 0 01/09/2019 at 0900  . magnesium  hydroxide (MILK OF MAGNESIA) 400 MG/5ML suspension Take by mouth daily as needed for mild constipation.   01/08/2019 at Unknown time  . oxyCODONE (OXY IR/ROXICODONE) 5 MG immediate release tablet Take 1-2 tablets (5-10 mg total) by mouth every 4 (four) hours as needed for severe pain. 20 tablet 0 01/09/2019 at 0200  . Prenatal Vit-Fe Fumarate-FA (PRENATAL MULTIVITAMIN) TABS tablet Take 1 tablet by mouth daily at 12 noon.   01/08/2019 at Unknown time  . Biotin 10 MG TABS Take 10 mg by mouth daily.     . cycloSPORINE (RESTASIS) 0.05 % ophthalmic emulsion Place 1 drop into both eyes 2 (two) times daily.     Marland Kitchen gabapentin (NEURONTIN) 100 MG capsule Take 100 mg by mouth 2 (two) times daily.     . vitamin E 400 UNIT capsule Take 400 Units by mouth daily. Pt bursts capsules and applies topically over scars/stretch marks       Review of  Systems  Gastrointestinal: Negative for abdominal pain.  Musculoskeletal: Negative for back pain.  All other systems reviewed and are negative.  Physical Exam   Blood pressure 126/86, pulse 78, temperature 98.6 F (37 C), temperature source Oral, resp. rate 18, SpO2 99 %, not currently breastfeeding.  Physical Exam  Nursing note and vitals reviewed. Constitutional: She is oriented to person, place, and time. She appears well-developed and well-nourished.  Cardiovascular: Normal rate.  Respiratory: Effort normal and breath sounds normal. No respiratory distress. She has no decreased breath sounds.  GI: Soft. She exhibits no distension. There is no abdominal tenderness. There is no rebound and no guarding.  Neurological: She is alert and oriented to person, place, and time.  Skin: Skin is warm and dry.  Psychiatric: She has a normal mood and affect. Her behavior is normal. Judgment and thought content normal.    MAU Course/MDM  Procedures  --RLE is noticeably more swollen then left. Doppler ordered --At bedside to discuss lab results at 1430. Patient denies pain   Patient Vitals for the past 24 hrs: Patient Vitals for the past 24 hrs:  BP Temp Temp src Pulse Resp SpO2  01/09/19 1331 126/86 - - 78 - -  01/09/19 1316 124/69 - - 81 - -  01/09/19 1305 - - - - - 99 %  01/09/19 1301 129/75 - - 86 - -  01/09/19 1300 - - - - - 98 %  01/09/19 1250 136/68 - - 84 - 99 %  01/09/19 1247 136/68 98.6 F (37 C) Oral 85 18 98 %    Results for orders placed or performed during the hospital encounter of 01/09/19 (from the past 24 hour(s))  CBC     Status: Abnormal   Collection Time: 01/09/19  1:23 PM  Result Value Ref Range   WBC 7.0 4.0 - 10.5 K/uL   RBC 3.00 (L) 3.87 - 5.11 MIL/uL   Hemoglobin 8.9 (L) 12.0 - 15.0 g/dL   HCT 28.3 (L) 36.0 - 46.0 %   MCV 94.3 80.0 - 100.0 fL   MCH 29.7 26.0 - 34.0 pg   MCHC 31.4 30.0 - 36.0 g/dL   RDW 13.9 11.5 - 15.5 %   Platelets 314 150 - 400 K/uL    nRBC 0.0 0.0 - 0.2 %  Comprehensive metabolic panel     Status: Abnormal   Collection Time: 01/09/19  1:23 PM  Result Value Ref Range   Sodium 139 135 - 145 mmol/L   Potassium 4.1 3.5 - 5.1 mmol/L  Chloride 108 98 - 111 mmol/L   CO2 23 22 - 32 mmol/L   Glucose, Bld 94 70 - 99 mg/dL   BUN 14 6 - 20 mg/dL   Creatinine, Ser 0.57 0.44 - 1.00 mg/dL   Calcium 8.5 (L) 8.9 - 10.3 mg/dL   Total Protein 6.0 (L) 6.5 - 8.1 g/dL   Albumin 2.6 (L) 3.5 - 5.0 g/dL   AST 21 15 - 41 U/L   ALT 36 0 - 44 U/L   Alkaline Phosphatase 110 38 - 126 U/L   Total Bilirubin 0.1 (L) 0.3 - 1.2 mg/dL   GFR calc non Af Amer >60 >60 mL/min   GFR calc Af Amer >60 >60 mL/min   Anion gap 8 5 - 15   Vas [place] Lower Extremity Venous (dvt) (only [facility])  Result Date: 01/09/2019  Lower Venous Study Indications: BLE swelling Right >Left, post partum pre eclampsia.  Performing Technologist: June Leap RDMS, RVT  Examination Guidelines: A complete evaluation includes B-mode imaging, spectral Doppler, color Doppler, and power Doppler as needed of all accessible portions of each vessel. Bilateral testing is considered an integral part of a complete examination. Limited examinations for reoccurring indications may be performed as noted.  +---------+---------------+---------+-----------+----------+--------------+ RIGHT    CompressibilityPhasicitySpontaneityPropertiesThrombus Aging +---------+---------------+---------+-----------+----------+--------------+ CFV      Full           Yes      Yes                                 +---------+---------------+---------+-----------+----------+--------------+ SFJ      Full                                                        +---------+---------------+---------+-----------+----------+--------------+ FV Prox  Full                                                        +---------+---------------+---------+-----------+----------+--------------+ FV Mid   Full                                                         +---------+---------------+---------+-----------+----------+--------------+ FV DistalFull                                                        +---------+---------------+---------+-----------+----------+--------------+ PFV      Full                                                        +---------+---------------+---------+-----------+----------+--------------+ POP      Full  Yes      Yes                                 +---------+---------------+---------+-----------+----------+--------------+ PTV      Full                                                        +---------+---------------+---------+-----------+----------+--------------+ PERO     Full                                                        +---------+---------------+---------+-----------+----------+--------------+   +----+---------------+---------+-----------+----------+--------------+ LEFTCompressibilityPhasicitySpontaneityPropertiesThrombus Aging +----+---------------+---------+-----------+----------+--------------+ CFV Full           Yes      Yes                                 +----+---------------+---------+-----------+----------+--------------+     Summary: Right: There is no evidence of deep vein thrombosis in the lower extremity. No cystic structure found in the popliteal fossa. Left: No evidence of common femoral vein obstruction.  *See table(s) above for measurements and observations.    Preliminary     Assessment and Plan  --38 y.o. [phone] postpartum patient --Normotensive in MAU, normal CBC and CMP --No evidence of DVT on Doppler --Denies pain at 1630 and for remainder of time in MAU --Discharge home in stable condition  F/U: - Patient has clinic appointment tomorrow 01/10/19  Darlina Rumpf, [place] 01/09/2019, 5:39 PM

## 2019-01-09 NOTE — MAU Note (Signed)
Gave birth last Tues.  Had PRE E.  Dr is worried about BP and swelling. Is not on BP medication. Slight pressure behind eyes, seeing some floaters and squiggles;  Reports increased swelling in legs and feet, denies epigastric pain.

## 2019-04-05 DIAGNOSIS — Z6837 Body mass index (BMI) 37.0-37.9, adult: Secondary | ICD-10-CM | POA: Diagnosis not present

## 2019-04-05 DIAGNOSIS — M545 Low back pain: Secondary | ICD-10-CM | POA: Diagnosis not present

## 2019-04-05 DIAGNOSIS — Z3042 Encounter for surveillance of injectable contraceptive: Secondary | ICD-10-CM | POA: Diagnosis not present

## 2019-04-08 DIAGNOSIS — H1013 Acute atopic conjunctivitis, bilateral: Secondary | ICD-10-CM | POA: Diagnosis not present

## 2019-04-08 DIAGNOSIS — H40033 Anatomical narrow angle, bilateral: Secondary | ICD-10-CM | POA: Diagnosis not present

## 2019-04-24 DIAGNOSIS — Z6835 Body mass index (BMI) 35.0-35.9, adult: Secondary | ICD-10-CM | POA: Diagnosis not present

## 2019-04-24 DIAGNOSIS — M545 Low back pain: Secondary | ICD-10-CM | POA: Diagnosis not present

## 2019-04-24 DIAGNOSIS — M533 Sacrococcygeal disorders, not elsewhere classified: Secondary | ICD-10-CM | POA: Diagnosis not present

## 2019-04-26 ENCOUNTER — Other Ambulatory Visit: Payer: Self-pay | Admitting: Orthopedic Surgery

## 2019-04-26 DIAGNOSIS — M545 Low back pain, unspecified: Secondary | ICD-10-CM

## 2019-04-26 DIAGNOSIS — G8929 Other chronic pain: Secondary | ICD-10-CM

## 2019-05-04 ENCOUNTER — Ambulatory Visit
Admission: RE | Admit: 2019-05-04 | Discharge: 2019-05-04 | Disposition: A | Discharge status: Home / Self Care | Payer: Medicaid Other | Source: Ambulatory Visit | Attending: Orthopedic Surgery | Admitting: Orthopedic Surgery

## 2019-05-04 ENCOUNTER — Other Ambulatory Visit: Payer: Self-pay

## 2019-05-04 DIAGNOSIS — G8929 Other chronic pain: Secondary | ICD-10-CM | POA: Diagnosis not present

## 2019-05-04 DIAGNOSIS — M79651 Pain in right thigh: Secondary | ICD-10-CM | POA: Diagnosis not present

## 2019-05-04 DIAGNOSIS — M79604 Pain in right leg: Secondary | ICD-10-CM | POA: Diagnosis not present

## 2019-05-04 DIAGNOSIS — M545 Low back pain, unspecified: Secondary | ICD-10-CM

## 2019-05-04 MED ORDER — METHYLPREDNISOLONE ACETATE 40 MG/ML INJ SUSP (RADIOLOG
120.0000 mg | Freq: Once | INTRAMUSCULAR | Status: AC
  Administered 2019-05-04: 120 mg via INTRA_ARTICULAR

## 2019-05-29 ENCOUNTER — Other Ambulatory Visit: Payer: Self-pay

## 2019-05-29 ENCOUNTER — Encounter (HOSPITAL_COMMUNITY): Payer: Self-pay

## 2019-05-29 ENCOUNTER — Ambulatory Visit (HOSPITAL_COMMUNITY)
Admission: EM | Admit: 2019-05-29 | Discharge: 2019-05-29 | Disposition: A | Discharge status: Home / Self Care | Payer: Medicaid Other | Attending: Family Medicine | Admitting: Family Medicine

## 2019-05-29 DIAGNOSIS — Z20822 Contact with and (suspected) exposure to covid-19: Secondary | ICD-10-CM | POA: Diagnosis not present

## 2019-05-29 DIAGNOSIS — Z79899 Other long term (current) drug therapy: Secondary | ICD-10-CM | POA: Diagnosis not present

## 2019-05-29 DIAGNOSIS — R6889 Other general symptoms and signs: Secondary | ICD-10-CM | POA: Diagnosis not present

## 2019-05-29 DIAGNOSIS — Z87891 Personal history of nicotine dependence: Secondary | ICD-10-CM | POA: Diagnosis not present

## 2019-05-29 DIAGNOSIS — J309 Allergic rhinitis, unspecified: Secondary | ICD-10-CM | POA: Insufficient documentation

## 2019-05-29 DIAGNOSIS — J029 Acute pharyngitis, unspecified: Secondary | ICD-10-CM | POA: Diagnosis not present

## 2019-05-29 LAB — POCT RAPID STREP A: Streptococcus, Group A Screen (Direct): NEGATIVE

## 2019-05-29 MED ORDER — FLUTICASONE PROPIONATE 50 MCG/ACT NA SUSP: 1.0000 | Freq: Every day | NASAL | 0 refills | Status: AC

## 2019-05-29 MED ORDER — CYCLOSPORINE 0.05 % OP EMUL: 1.0000 [drp] | Freq: Two times a day (BID) | OPHTHALMIC | 0 refills | Status: AC

## 2019-05-29 MED ORDER — CETIRIZINE HCL 10 MG PO CAPS: 10.0000 mg | ORAL_CAPSULE | Freq: Every day | ORAL | 0 refills | Status: AC

## 2019-05-29 NOTE — ED Provider Notes (Signed)
Holly Whitehead    CSN: [id number] Arrival date & time: 05/29/19  1203      History   Chief Complaint Chief Complaint  Patient presents with  . Sore Throat    HPI Holly Whitehead is a 39 y.o. female history of migraines presenting today for evaluation of sore throat.  Patient notes that she began to develop a sore throat this morning.  She also has noticed some looser stools.  Reports she has had some rhinorrhea and congestion with a mild cough over the past few days as well.  She denies any fevers chills or body aches.  Also reporting significant dry eyes, this has been recurrent for her.  Previously was prescribed Restasis by eye doctor, requesting refill of this.  Concerned about strep.   HPI  Past Medical History:  Diagnosis Date  . Acid reflux   . Anemia   . Anxiety   . Chicken pox    as a child  . Constipation   . Headache    migraines  . Pregnancy induced hypertension   . Sciatica   . Single artery and vein of umbilical cord affecting care of newborn 11/08/2014  . SVD (spontaneous vaginal delivery) 11/08/2014  . Uterine cancer [facility])     Patient Active Problem List   Diagnosis Date Noted  . Preeclampsia, severe, third trimester 01/02/2019  . [redacted] weeks gestation of pregnancy 01/02/2019  . Pregnant 11/08/2014  . Single artery and vein of umbilical cord affecting care of newborn 11/08/2014  . SVD (spontaneous vaginal delivery) 11/08/2014    Past Surgical History:  Procedure Laterality Date  . LUMBAR LAMINECTOMY/DECOMPRESSION MICRODISCECTOMY Right 09/24/2016   Procedure: RIGHT SIDED LUMBAR 5-SACRUM 1 MICRODISCECTOMY;  Surgeon: Phylliss Bob, MD;  Location: [facility];  Service: Orthopedics;  Laterality: Right;  RIGHT SIDED LUMBAR 5-SACRUM 1 MICRODISCECTOMY    OB History    Gravida  8   Para  7   Term  7   Preterm  0   AB  1   Living  7     SAB  1   TAB  0   Ectopic  0   Multiple  0   Live Births  4            Home Medications    Prior  to Admission medications   Medication Sig Start Date End Date Taking? Authorizing Provider  ibuprofen (ADVIL) 600 MG tablet Take 1 tablet (600 mg total) by mouth every 6 (six) hours. 01/05/19  Yes Paula Compton, MD  metoCLOPramide (REGLAN) 10 MG tablet metoclopramide 10 mg tablet  TAKE 1 TABLET BY MOUTH THREE TIMES DAILY   Yes [provider]  Prenatal Vit-Fe Fumarate-FA (PRENATAL MULTIVITAMIN) TABS tablet Take 1 tablet by mouth daily at 12 noon.   Yes [provider]  Biotin 10 MG TABS Take 10 mg by mouth daily.    [provider]  Cetirizine HCl 10 MG CAPS Take 1 capsule (10 mg total) by mouth daily. 05/29/19   Maylin Freeburg C, PA-C  cycloSPORINE (RESTASIS) 0.05 % ophthalmic emulsion Place 1 drop into both eyes 2 (two) times daily. 05/29/19   Denis Carreon C, PA-C  docusate sodium (COLACE) 100 MG capsule Take 100 mg by mouth 2 (two) times daily.    [provider]  fluticasone (FLONASE) 50 MCG/ACT nasal spray Place 1-2 sprays into both nostrils daily for 7 days. 05/29/19 06/05/19  Katy Brickell C, PA-C  gabapentin (NEURONTIN) 100 MG capsule Take 100  mg by mouth 2 (two) times daily.    [provider]  magnesium hydroxide (MILK OF MAGNESIA) 400 MG/5ML suspension Take by mouth daily as needed for mild constipation.    [provider]  oxyCODONE (OXY IR/ROXICODONE) 5 MG immediate release tablet Take 1-2 tablets (5-10 mg total) by mouth every 4 (four) hours as needed for severe pain. 01/05/19   Paula Compton, MD  vitamin E 400 UNIT capsule Take 400 Units by mouth daily. Pt bursts capsules and applies topically over scars/stretch marks    [provider]    Family History Family History  Problem Relation Age of Onset  . Diabetes Maternal Grandmother   . Healthy Father   . Breast cancer Half-Sister   . Uterine cancer Half-Sister   . Cancer Paternal Grandfather   . Leukemia Paternal Grandfather   . Healthy Mother     Social  History Social History   Tobacco Use  . Smoking status: Former Smoker    Packs/day: 0.25    Types: Cigarettes  . Smokeless tobacco: Former Systems developer    Quit date: 10/20/2006  Substance Use Topics  . Alcohol use: No  . Drug use: No     Allergies   Bee venom and Adhesive [tape]   Review of Systems Review of Systems  Constitutional: Negative for activity change, appetite change, chills, fatigue and fever.  HENT: Positive for congestion, rhinorrhea and sore throat. Negative for ear pain, sinus pressure and trouble swallowing.   Eyes: Positive for itching. Negative for discharge and redness.  Respiratory: Positive for cough. Negative for chest tightness and shortness of breath.   Cardiovascular: Negative for chest pain.  Gastrointestinal: Positive for abdominal pain, diarrhea and nausea. Negative for vomiting.  Musculoskeletal: Negative for myalgias.  Skin: Negative for rash.  Neurological: Negative for dizziness, light-headedness and headaches.     Physical Exam Triage Vital Signs ED Triage Vitals  Enc Vitals Group     BP 05/29/19 1237 133/86     Pulse Rate 05/29/19 1237 92     Resp 05/29/19 1237 18     Temp 05/29/19 1237 98 F (36.7 C)     Temp Source 05/29/19 1237 Oral     SpO2 05/29/19 1237 100 %     Weight --      Height --      Head Circumference --      Peak Flow --      Pain Score 05/29/19 1231 7     Pain Loc --      Pain Edu? --      Excl. in [place]? --    No data found.  Updated Vital Signs BP 133/86 (BP Location: Left Arm)   Pulse 92   Temp 98 F (36.7 C) (Oral)   Resp 18   LMP 05/22/2019   SpO2 100%   Visual Acuity Right Eye Distance:   Left Eye Distance:   Bilateral Distance:    Right Eye Near:   Left Eye Near:    Bilateral Near:     Physical Exam Vitals and nursing note reviewed.  Constitutional:      Appearance: She is well-developed.     Comments: No acute distress  HENT:     Head: Normocephalic and atraumatic.     Ears:     Comments:  Bilateral ears without tenderness to palpation of external auricle, tragus and mastoid, EAC's without erythema or swelling, TM's with good bony landmarks and cone of light. Non erythematous.  Nose: Nose normal.     Comments: Nasal mucosa pink, nonswollen turbinates    Mouth/Throat:     Comments: Oral mucosa pink and moist, no tonsillar enlargement or exudate. Posterior pharynx patent and nonerythematous, no uvula deviation or swelling. Normal phonation. Eyes:     Conjunctiva/sclera: Conjunctivae normal.  Cardiovascular:     Rate and Rhythm: Normal rate.  Pulmonary:     Effort: Pulmonary effort is normal. No respiratory distress.     Comments: Breathing comfortably at rest, CTABL, no wheezing, rales or other adventitious sounds auscultated Abdominal:     General: There is no distension.  Musculoskeletal:        General: Normal range of motion.     Cervical back: Neck supple.  Skin:    General: Skin is warm and dry.  Neurological:     Mental Status: She is alert and oriented to person, place, and time.      UC Treatments / Results  Labs (all labs ordered are listed, but only abnormal results are displayed) Labs Reviewed  SARS CORONAVIRUS 2 (TAT 6-24 HRS)  CULTURE, GROUP A STREP [facility])  POCT RAPID STREP A    EKG   Radiology No results found.  Procedures Procedures (including critical care time)  Medications Ordered in UC Medications - No data to display  Initial Impression / Assessment and Plan / UC Course  I have reviewed the triage vital signs and the nursing notes.  Pertinent labs & imaging results that were available during my care of the patient were reviewed by me and considered in my medical decision making (see chart for details).     Covid PCR pending, strep test negative, strep culture pending.  Most likely allergic rhinitis versus viral URI.  Recommending symptomatic and supportive care.  Initiated on Zyrtec and Flonase along with refilling Restasis.   GI symptoms reported as chronic/episodic for her.  Follow-up with PCP/gastroenterology.  Discussed strict return precautions. Patient verbalized understanding and is agreeable with plan.  Final Clinical Impressions(s) / UC Diagnoses   Final diagnoses:  Sore throat  Allergic rhinitis, unspecified seasonality, unspecified trigger     Discharge Instructions     Strep test negative, COVID swab pending Begin daily cetirizine/Zyrtec to help with congestion, postnasal drainage/allergies  Flonase nasal spray 1 to 2 spray in each nostril daily Refilled Restasis  Please follow-up if symptoms not improving or worsening   ED Prescriptions    Medication Sig Dispense Auth. Provider   cycloSPORINE (RESTASIS) 0.05 % ophthalmic emulsion Place 1 drop into both eyes 2 (two) times daily. 5.5 mL Cindel Daugherty C, PA-C   Cetirizine HCl 10 MG CAPS Take 1 capsule (10 mg total) by mouth daily. 30 capsule Araiyah Cumpton C, PA-C   fluticasone (FLONASE) 50 MCG/ACT nasal spray Place 1-2 sprays into both nostrils daily for 7 days. 1 g Kane Kusek, [place] C, PA-C     PDMP not reviewed this encounter.   Janith Lima, PA-C 05/29/19 1353

## 2019-05-29 NOTE — ED Triage Notes (Signed)
Pt c/o sore throat and loose stools onset this morning. Pt states she also has runny nose, cough for approx 1 week. Also reports dry eyes and wants to discuss Rx for same. Denies abdom pain, n/v, fever, chills.  Took 400mg  ibuprofen at approx 0530.

## 2019-05-29 NOTE — Discharge Instructions (Signed)
Strep test negative, COVID swab pending Begin daily cetirizine/Zyrtec to help with congestion, postnasal drainage/allergies  Flonase nasal spray 1 to 2 spray in each nostril daily Refilled Restasis  Please follow-up if symptoms not improving or worsening

## 2019-05-30 LAB — SARS CORONAVIRUS 2 (TAT 6-24 HRS): SARS Coronavirus 2: NEGATIVE

## 2019-06-01 LAB — CULTURE, GROUP A STREP (THRC)

## 2019-07-03 DIAGNOSIS — Z3042 Encounter for surveillance of injectable contraceptive: Secondary | ICD-10-CM | POA: Diagnosis not present

## 2019-07-20 DIAGNOSIS — Z20828 Contact with and (suspected) exposure to other viral communicable diseases: Secondary | ICD-10-CM | POA: Diagnosis not present

## 2019-07-20 DIAGNOSIS — U071 COVID-19: Secondary | ICD-10-CM | POA: Diagnosis not present

## 2019-09-28 DIAGNOSIS — Z3042 Encounter for surveillance of injectable contraceptive: Secondary | ICD-10-CM | POA: Diagnosis not present

## 2019-12-26 DIAGNOSIS — Z3042 Encounter for surveillance of injectable contraceptive: Secondary | ICD-10-CM | POA: Diagnosis not present

## 2020-03-21 DIAGNOSIS — Z3042 Encounter for surveillance of injectable contraceptive: Secondary | ICD-10-CM | POA: Diagnosis not present

## 2020-07-20 IMAGING — CT CT BIOPSY
1 of 5 series · 10 of 32 positions shown, 16 images · non-contrast
Comparison: none

CLINICAL DATA: Chronic right buttock and thigh pain. Good relief
from prior right sacroiliac joint anesthetic injection in 9160.

[Series 2: needle -guided injection · axial · 0.93mm/px · z∈[-174,-78]mm · 10 of 60 slices shown, 16 images]
[im 6/60  soft-tissue]
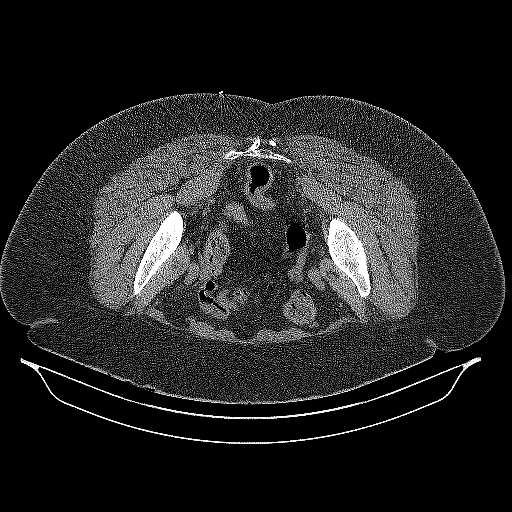
[im 6/60  bone]
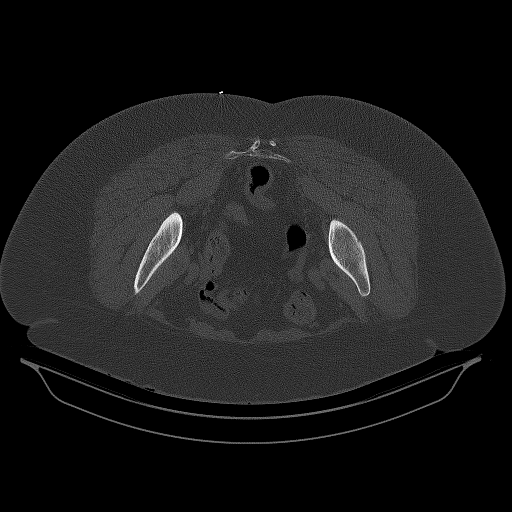
[im 11/60  soft-tissue]
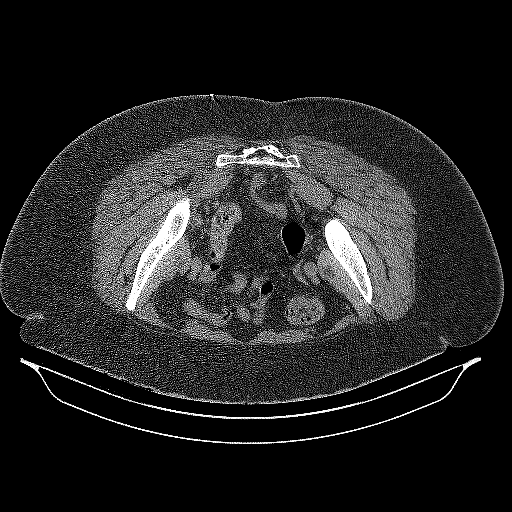
[im 17/60  soft-tissue]
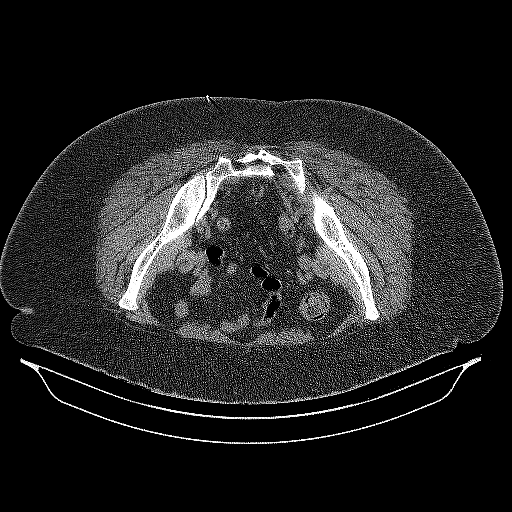
[im 22/60  soft-tissue]
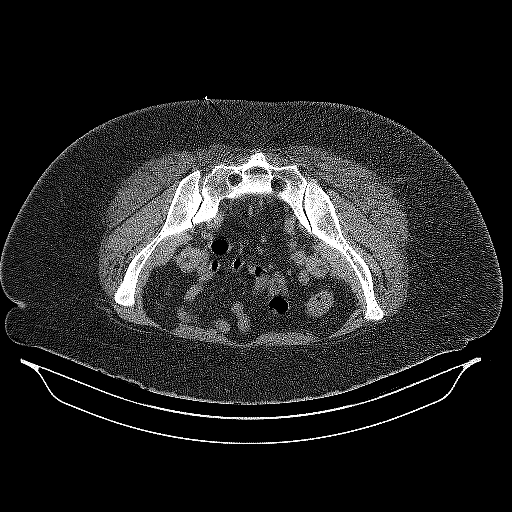
[im 27/60  soft-tissue]
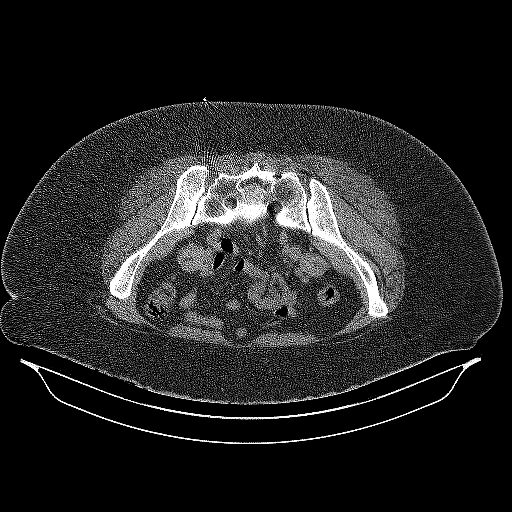
[im 33/60  soft-tissue]
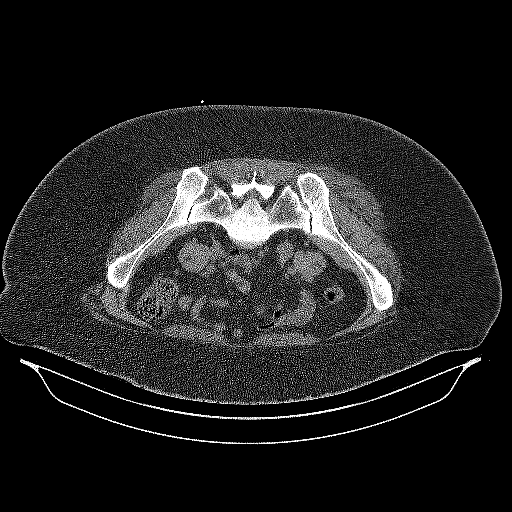
[im 38/60  soft-tissue]
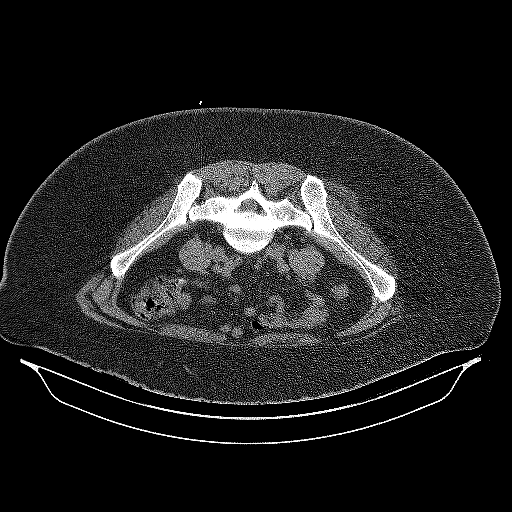
[im 38/60  lung]
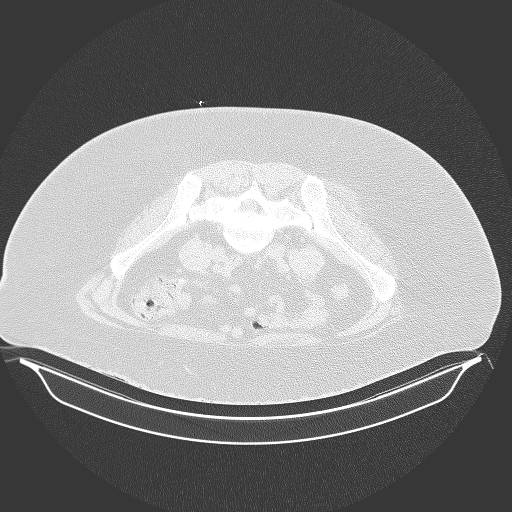
[im 43/60  soft-tissue]
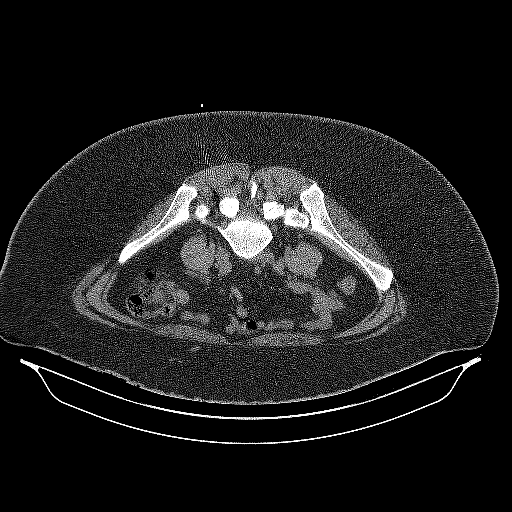
[im 43/60  lung]
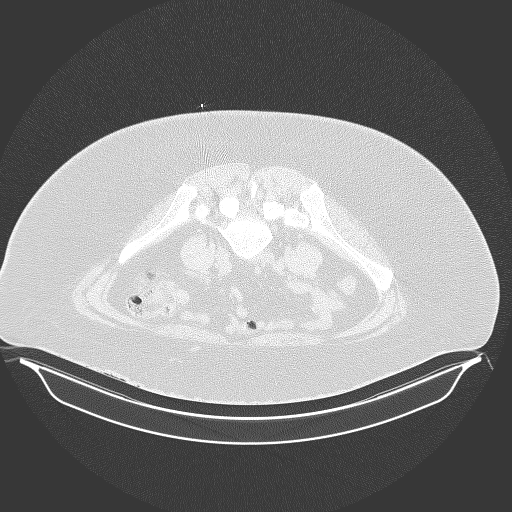
[im 49/60  soft-tissue]
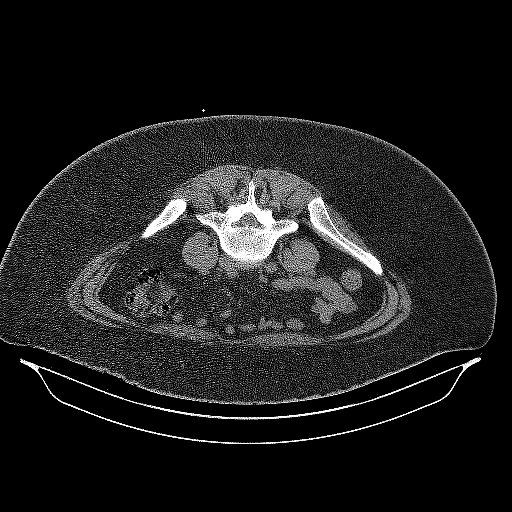
[im 49/60  lung]
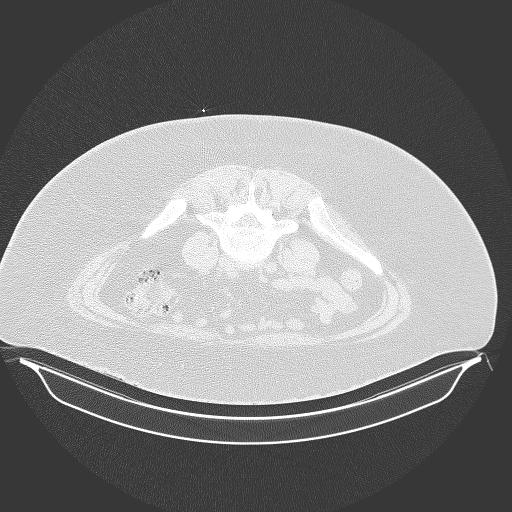
[im 49/60  bone]
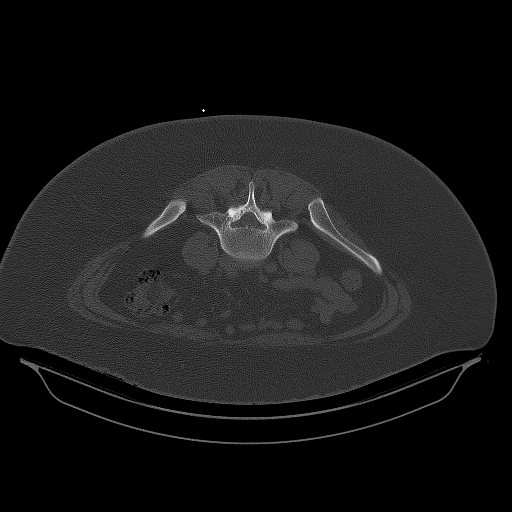
[im 54/60  soft-tissue]
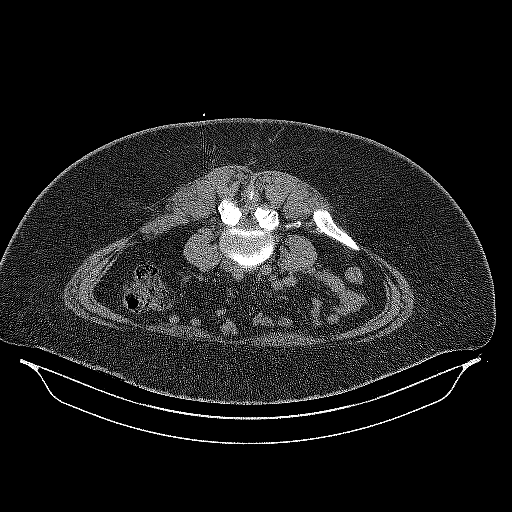
[im 54/60  lung]
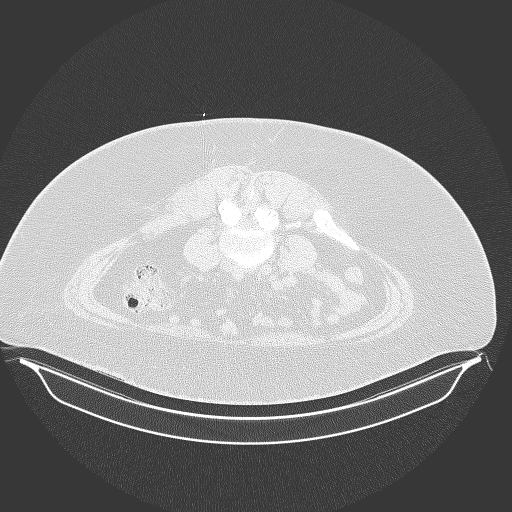

[10 of 32 positions shown; findings below may reference images not displayed]

EXAM:
CT-GUIDED RIGHT SACROILIAC JOINT INJECTION

PROCEDURE:
After a thorough discussion of risks and benefits of the procedure,
including bleeding, infection, injury to nerves, blood vessels, and
adjacent structures, verbal and written consent was obtained. The
patient was placed prone on the CT table and localization was
performed over the sacrum. Target site was marked using CT guidance.
The skin was prepped and draped in the usual sterile fashion using
Betadine soap.

After local anesthesia with 1% lidocaine without epinephrine and
subsequent deep anesthesia, a 3.5 inch 22 gauge spinal needle was
advanced into the right SI joint. Injection of 0.5 ml Isovue-M 200
confirmed intra-articular placement. No vascular uptake present.
Subsequently, 120 mg of Depo-Medrol mixed with 0.5 mL of 0.5%
bupivacaine were injected into the right SI joint. Needle was
removed and a sterile dressing applied.

No complications were observed. The patient was observed and
released under the care of a driver after 30 minutes.
IMPRESSION: Successful CT guided right SI joint injection with steroid and
anesthetic.

## 2021-02-05 DIAGNOSIS — Z3042 Encounter for surveillance of injectable contraceptive: Secondary | ICD-10-CM | POA: Diagnosis not present

## 2021-02-05 DIAGNOSIS — Z6841 Body Mass Index (BMI) 40.0 and over, adult: Secondary | ICD-10-CM | POA: Diagnosis not present

## 2021-02-05 DIAGNOSIS — Z3009 Encounter for other general counseling and advice on contraception: Secondary | ICD-10-CM | POA: Diagnosis not present

## 2021-02-05 DIAGNOSIS — Z124 Encounter for screening for malignant neoplasm of cervix: Secondary | ICD-10-CM | POA: Diagnosis not present

## 2021-02-05 DIAGNOSIS — R895 Abnormal microbiological findings in specimens from other organs, systems and tissues: Secondary | ICD-10-CM | POA: Diagnosis not present

## 2021-02-05 DIAGNOSIS — Z130 Encounter for screening for diseases of the blood and blood-forming organs and certain disorders involving the immune mechanism: Secondary | ICD-10-CM | POA: Diagnosis not present

## 2021-02-05 DIAGNOSIS — Z1389 Encounter for screening for other disorder: Secondary | ICD-10-CM | POA: Diagnosis not present

## 2021-02-05 DIAGNOSIS — Z01419 Encounter for gynecological examination (general) (routine) without abnormal findings: Secondary | ICD-10-CM | POA: Diagnosis not present

## 2021-02-05 DIAGNOSIS — Z1151 Encounter for screening for human papillomavirus (HPV): Secondary | ICD-10-CM | POA: Diagnosis not present

## 2021-02-06 DIAGNOSIS — Z30013 Encounter for initial prescription of injectable contraceptive: Secondary | ICD-10-CM | POA: Diagnosis not present

## 2021-03-21 DIAGNOSIS — Z6841 Body Mass Index (BMI) 40.0 and over, adult: Secondary | ICD-10-CM | POA: Diagnosis not present

## 2021-03-21 DIAGNOSIS — K219 Gastro-esophageal reflux disease without esophagitis: Secondary | ICD-10-CM | POA: Diagnosis not present

## 2021-04-18 DIAGNOSIS — M5442 Lumbago with sciatica, left side: Secondary | ICD-10-CM | POA: Diagnosis not present

## 2021-04-18 DIAGNOSIS — Z79899 Other long term (current) drug therapy: Secondary | ICD-10-CM | POA: Diagnosis not present

## 2021-04-18 DIAGNOSIS — M549 Dorsalgia, unspecified: Secondary | ICD-10-CM | POA: Diagnosis not present

## 2021-04-18 DIAGNOSIS — Z87891 Personal history of nicotine dependence: Secondary | ICD-10-CM | POA: Diagnosis not present

## 2021-04-18 DIAGNOSIS — G43009 Migraine without aura, not intractable, without status migrainosus: Secondary | ICD-10-CM | POA: Diagnosis not present

## 2021-04-18 DIAGNOSIS — Z6841 Body Mass Index (BMI) 40.0 and over, adult: Secondary | ICD-10-CM | POA: Diagnosis not present

## 2021-04-18 DIAGNOSIS — G8929 Other chronic pain: Secondary | ICD-10-CM | POA: Diagnosis not present

## 2021-04-18 DIAGNOSIS — R7303 Prediabetes: Secondary | ICD-10-CM | POA: Diagnosis not present

## 2021-04-18 DIAGNOSIS — M5441 Lumbago with sciatica, right side: Secondary | ICD-10-CM | POA: Diagnosis not present

## 2021-04-18 DIAGNOSIS — K219 Gastro-esophageal reflux disease without esophagitis: Secondary | ICD-10-CM | POA: Diagnosis not present

## 2021-04-24 DIAGNOSIS — Z3042 Encounter for surveillance of injectable contraceptive: Secondary | ICD-10-CM | POA: Diagnosis not present

## 2021-06-04 DIAGNOSIS — R300 Dysuria: Secondary | ICD-10-CM | POA: Diagnosis not present

## 2021-06-20 DIAGNOSIS — R7303 Prediabetes: Secondary | ICD-10-CM | POA: Diagnosis not present

## 2021-06-20 DIAGNOSIS — Z6841 Body Mass Index (BMI) 40.0 and over, adult: Secondary | ICD-10-CM | POA: Diagnosis not present

## 2021-08-04 DIAGNOSIS — Z6841 Body Mass Index (BMI) 40.0 and over, adult: Secondary | ICD-10-CM | POA: Diagnosis not present

## 2021-09-27 ENCOUNTER — Emergency Department (HOSPITAL_BASED_OUTPATIENT_CLINIC_OR_DEPARTMENT_OTHER)
Admission: EM | Admit: 2021-09-27 | Discharge: 2021-09-27 | Disposition: A | Discharge status: Home / Self Care | Payer: Medicaid Other | Attending: Emergency Medicine | Admitting: Emergency Medicine

## 2021-09-27 ENCOUNTER — Other Ambulatory Visit: Payer: Self-pay

## 2021-09-27 ENCOUNTER — Encounter (HOSPITAL_BASED_OUTPATIENT_CLINIC_OR_DEPARTMENT_OTHER): Payer: Self-pay

## 2021-09-27 ENCOUNTER — Emergency Department (HOSPITAL_BASED_OUTPATIENT_CLINIC_OR_DEPARTMENT_OTHER): Payer: Medicaid Other | Admitting: Radiology

## 2021-09-27 DIAGNOSIS — R000 Tachycardia, unspecified: Secondary | ICD-10-CM | POA: Diagnosis not present

## 2021-09-27 DIAGNOSIS — S20212A Contusion of left front wall of thorax, initial encounter: Secondary | ICD-10-CM | POA: Diagnosis not present

## 2021-09-27 DIAGNOSIS — S299XXA Unspecified injury of thorax, initial encounter: Secondary | ICD-10-CM | POA: Diagnosis present

## 2021-09-27 DIAGNOSIS — M79671 Pain in right foot: Secondary | ICD-10-CM | POA: Diagnosis not present

## 2021-09-27 DIAGNOSIS — W010XXA Fall on same level from slipping, tripping and stumbling without subsequent striking against object, initial encounter: Secondary | ICD-10-CM | POA: Diagnosis not present

## 2021-09-27 DIAGNOSIS — Z043 Encounter for examination and observation following other accident: Secondary | ICD-10-CM | POA: Diagnosis not present

## 2021-09-27 MED ORDER — HYDROCODONE-ACETAMINOPHEN 5-325 MG PO TABS
1.0000 | ORAL_TABLET | Freq: Once | ORAL | Status: AC
  Administered 2021-09-27: 1 via ORAL
  Filled 2021-09-27: qty 1

## 2021-09-27 MED ORDER — HYDROCODONE-ACETAMINOPHEN 5-325 MG PO TABS: 1.0000 | ORAL_TABLET | Freq: Four times a day (QID) | ORAL | 0 refills | Status: AC | PRN

## 2021-09-27 MED ORDER — IBUPROFEN 800 MG PO TABS: 800.0000 mg | ORAL_TABLET | Freq: Three times a day (TID) | ORAL | 0 refills | Status: AC

## 2021-09-27 MED ORDER — CYCLOBENZAPRINE HCL 10 MG PO TABS: 10.0000 mg | ORAL_TABLET | Freq: Two times a day (BID) | ORAL | 0 refills | Status: DC | PRN

## 2021-09-27 NOTE — ED Provider Notes (Signed)
[place] EMERGENCY DEPT Provider Note   CSN: [id number] Arrival date & time: 09/27/21  1825     History {Add pertinent medical, surgical, social history, OB history to HPI:1} Chief Complaint  Patient presents with   Holly Whitehead is a 41 y.o. female.   Fall   Patient presents to the ED for evaluation of left-sided rib pain.  Patient states she tripped and fell off the porch the other day.  Since that time she has had persistent pain in her left anterior and posterior ribs.  She has been taking ibuprofen without much relief.  Patient states it hurts for her to take a deep breath.  She has not had any abdominal pain.  No vomiting or diarrhea.  Patient also scraped her right foot but is not having any trouble walking.  No other complaints of injuries    Home Medications Prior to Admission medications   Medication Sig Start Date End Date Taking? Authorizing Provider  Biotin 10 MG TABS Take 10 mg by mouth daily.    [provider]  Cetirizine HCl 10 MG CAPS Take 1 capsule (10 mg total) by mouth daily. 05/29/19   Wieters, Hallie C, PA-C  cycloSPORINE (RESTASIS) 0.05 % ophthalmic emulsion Place 1 drop into both eyes 2 (two) times daily. 05/29/19   Wieters, Hallie C, PA-C  docusate sodium (COLACE) 100 MG capsule Take 100 mg by mouth 2 (two) times daily.    [provider]  fluticasone (FLONASE) 50 MCG/ACT nasal spray Place 1-2 sprays into both nostrils daily for 7 days. 05/29/19 06/05/19  Wieters, Hallie C, PA-C  gabapentin (NEURONTIN) 100 MG capsule Take 100 mg by mouth 2 (two) times daily.    [provider]  ibuprofen (ADVIL) 600 MG tablet Take 1 tablet (600 mg total) by mouth every 6 (six) hours. 01/05/19   Paula Compton, MD  magnesium hydroxide (MILK OF MAGNESIA) 400 MG/5ML suspension Take by mouth daily as needed for mild constipation.    [provider]  metoCLOPramide (REGLAN) 10 MG tablet metoclopramide 10 mg tablet  TAKE  1 TABLET BY MOUTH THREE TIMES DAILY    [provider]  oxyCODONE (OXY IR/ROXICODONE) 5 MG immediate release tablet Take 1-2 tablets (5-10 mg total) by mouth every 4 (four) hours as needed for severe pain. 01/05/19   Paula Compton, MD  Prenatal Vit-Fe Fumarate-FA (PRENATAL MULTIVITAMIN) TABS tablet Take 1 tablet by mouth daily at 12 noon.    [provider]  vitamin E 400 UNIT capsule Take 400 Units by mouth daily. Pt bursts capsules and applies topically over scars/stretch marks    [provider]      Allergies    Bee venom and Adhesive [tape]    Review of Systems   Review of Systems  Physical Exam Updated Vital Signs BP (!) 137/108   Pulse (!) 110   Temp (!) 97.3 F (36.3 C) (Temporal)   Resp 18   Ht 1.651 m ('5\' 5"'$ )   Wt 123.4 kg   SpO2 100%   BMI 45.27 kg/m  Physical Exam Vitals and nursing note reviewed.  Constitutional:      General: She is not in acute distress.    Appearance: She is well-developed.     Comments: Elevated BMI  HENT:     Head: Normocephalic and atraumatic.     Right Ear: External ear normal.     Left Ear: External ear normal.  Eyes:     General:  No scleral icterus.       Right eye: No discharge.        Left eye: No discharge.     Conjunctiva/sclera: Conjunctivae normal.  Neck:     Trachea: No tracheal deviation.  Cardiovascular:     Rate and Rhythm: Tachycardia present.  Pulmonary:     Effort: Pulmonary effort is normal. No respiratory distress.     Breath sounds: No stridor. No wheezing.     Comments: Tenderness palpation left anterior and posterior chest, no crepitus, no large bruising noted Chest:     Chest wall: Tenderness present.  Abdominal:     General: There is no distension.     Palpations: There is no mass.     Tenderness: There is no abdominal tenderness. There is no right CVA tenderness or left CVA tenderness.  Musculoskeletal:        General: No swelling or deformity.     Cervical back: Neck  supple.  Skin:    General: Skin is warm and dry.     Findings: No rash.  Neurological:     Mental Status: She is alert.     Cranial Nerves: Cranial nerve deficit: no gross deficits.     ED Results / Procedures / Treatments   Labs (all labs ordered are listed, but only abnormal results are displayed) Labs Reviewed - No data to display  EKG None  Radiology DG Ribs Unilateral W/Chest Left  Result Date: 09/27/2021 CLINICAL DATA:  Fall. EXAM: LEFT RIBS AND CHEST - 3+ VIEW COMPARISON:  Chest x-ray 09/17/2016 FINDINGS: No fracture or other bone lesions are seen involving the ribs. There is no evidence of pneumothorax or pleural effusion. Both lungs are clear. Heart size and mediastinal contours are within normal limits. IMPRESSION: Negative. Electronically Signed   By: Ronney Asters M.D.   On: 09/27/2021 20:08   DG Foot Complete Right  Result Date: 09/27/2021 CLINICAL DATA:  Recent fall with right foot pain, initial encounter EXAM: RIGHT FOOT COMPLETE - 3+ VIEW COMPARISON:  None Available. FINDINGS: There is no evidence of fracture or dislocation. There is no evidence of arthropathy or other focal bone abnormality. Soft tissues are unremarkable. IMPRESSION: No acute abnormality noted. Electronically Signed   By: Inez Catalina M.D.   On: 09/27/2021 20:08    Procedures Procedures  {Document cardiac monitor, telemetry assessment procedure when appropriate:1}  Medications Ordered in ED Medications  HYDROcodone-acetaminophen (NORCO/VICODIN) 5-325 MG per tablet 1 tablet (has no administration in time range)    ED Course/ Medical Decision Making/ A&P                           Medical Decision Making Amount and/or Complexity of Data Reviewed Radiology: ordered and independent interpretation performed. Decision-making details documented in ED Course.    Details: X-rays do not show any signs of rib fracture.  No pneumothorax.  Foot x-rays without fracture or dislocation  Risk Prescription  drug management.   Patient presents with rib pain primarily after fall.  She also has some mild foot pain.  X-rays do not show any signs of fracture or dislocation.  Patient's not having abdominal pain to suggest possible splenic injury.  {Document critical care time when appropriate:1} {Document review of labs and clinical decision tools ie heart score, Chads2Vasc2 etc:1}  {Document your independent review of radiology images, and any outside records:1} {Document your discussion with family members, caretakers, and with consultants:1} {Document social determinants of  health affecting pt's care:1} {Document your decision making why or why not admission, treatments were needed:1} Final Clinical Impression(s) / ED Diagnoses Final diagnoses:  None    Rx / DC Orders ED Discharge Orders     None

## 2021-09-27 NOTE — ED Triage Notes (Signed)
Patient here POV from Home.  Endorses a Mechanical Fall a few days ago when she tripped on a Ramp and fell onto the [employer]. No Head Injury. No LOC.   Pain to Left Chest that Radiates to Back as well as Right Foot. No Anticoagulants.   NAD Noted during Triage. A&Ox4. GCS 15. Ambulatory.

## 2021-09-27 NOTE — Discharge Instructions (Addendum)
The x-rays did not show any signs of acute rib fracture or punctured lung but it is always possible to have a hairline rib fracture that does not show up on x-ray.  Take the medications to help with pain and discomfort.  The symptoms should improve in the next week.  Return as needed for worsening symptoms

## 2021-12-09 ENCOUNTER — Encounter (HOSPITAL_BASED_OUTPATIENT_CLINIC_OR_DEPARTMENT_OTHER): Payer: Self-pay | Admitting: Emergency Medicine

## 2021-12-09 ENCOUNTER — Emergency Department (HOSPITAL_BASED_OUTPATIENT_CLINIC_OR_DEPARTMENT_OTHER)
Admission: EM | Admit: 2021-12-09 | Discharge: 2021-12-09 | Disposition: A | Discharge status: Home / Self Care | Payer: Medicaid Other | Attending: Emergency Medicine | Admitting: Emergency Medicine

## 2021-12-09 ENCOUNTER — Other Ambulatory Visit: Payer: Self-pay

## 2021-12-09 DIAGNOSIS — M5431 Sciatica, right side: Secondary | ICD-10-CM

## 2021-12-09 DIAGNOSIS — M5441 Lumbago with sciatica, right side: Secondary | ICD-10-CM | POA: Diagnosis not present

## 2021-12-09 DIAGNOSIS — M545 Low back pain, unspecified: Secondary | ICD-10-CM | POA: Diagnosis present

## 2021-12-09 DIAGNOSIS — Z8541 Personal history of malignant neoplasm of cervix uteri: Secondary | ICD-10-CM | POA: Diagnosis not present

## 2021-12-09 MED ORDER — KETOROLAC TROMETHAMINE 15 MG/ML IJ SOLN
15.0000 mg | Freq: Once | INTRAMUSCULAR | Status: AC
  Administered 2021-12-09: 15 mg via INTRAMUSCULAR
  Filled 2021-12-09: qty 1

## 2021-12-09 MED ORDER — PREDNISONE 20 MG PO TABS: 40.0000 mg | ORAL_TABLET | Freq: Every day | ORAL | 0 refills | Status: DC

## 2021-12-09 MED ORDER — CYCLOBENZAPRINE HCL 10 MG PO TABS: 10.0000 mg | ORAL_TABLET | Freq: Two times a day (BID) | ORAL | 0 refills | Status: DC | PRN

## 2021-12-09 MED ORDER — PREDNISONE 20 MG PO TABS: 40.0000 mg | ORAL_TABLET | Freq: Every day | ORAL | 0 refills | Status: AC

## 2021-12-09 MED ORDER — PREDNISONE 50 MG PO TABS
60.0000 mg | ORAL_TABLET | Freq: Once | ORAL | Status: AC
  Administered 2021-12-09: 60 mg via ORAL
  Filled 2021-12-09: qty 1

## 2021-12-09 MED ORDER — CYCLOBENZAPRINE HCL 10 MG PO TABS: 10.0000 mg | ORAL_TABLET | Freq: Two times a day (BID) | ORAL | 0 refills | Status: AC | PRN

## 2021-12-09 NOTE — ED Notes (Signed)
Discharge paperwork given and verbally understood. 

## 2021-12-09 NOTE — ED Provider Notes (Signed)
[place] EMERGENCY DEPT Provider Note   CSN: [id number] Arrival date & time: 12/09/21  1546     History  Chief Complaint  Patient presents with   Leg Pain    Holly Whitehead is a 41 y.o. female.   Leg Pain Patient presents with right buttock pain going down her leg.  History of back pain has had surgery.  Has had some issues since.  Had surgery a few years ago and has been doing pretty well since then but over the last 2 weeks has been worse.  Worse with certain positions.  Did have a fall a couple months ago but states back did not hurt at that time.  No current injury.  No loss of bladder bowel control.  Some improvement at times with ibuprofen and has taken some muscle relaxers but has not been well the last couple days.  Also is on Neurontin it is due a refill.    Past Medical History:  Diagnosis Date   Acid reflux    Anemia    Anxiety    Chicken pox    as a child   Constipation    Headache    migraines   Pregnancy induced hypertension    Sciatica    Single artery and vein of umbilical cord affecting care of newborn 11/08/2014   SVD (spontaneous vaginal delivery) 11/08/2014   Uterine cancer ([place])     Home Medications Prior to Admission medications   Medication Sig Start Date End Date Taking? Authorizing Provider  predniSONE (DELTASONE) 20 MG tablet Take 2 tablets (40 mg total) by mouth daily. 12/09/21  Yes Davonna Belling, MD  Biotin 10 MG TABS Take 10 mg by mouth daily.    [provider]  Cetirizine HCl 10 MG CAPS Take 1 capsule (10 mg total) by mouth daily. 05/29/19   Wieters, Hallie C, PA-C  cyclobenzaprine (FLEXERIL) 10 MG tablet Take 1 tablet (10 mg total) by mouth 2 (two) times daily as needed for muscle spasms. 12/09/21   Davonna Belling, MD  cycloSPORINE (RESTASIS) 0.05 % ophthalmic emulsion Place 1 drop into both eyes 2 (two) times daily. 05/29/19   Wieters, Hallie C, PA-C  docusate sodium (COLACE) 100 MG capsule Take 100 mg by mouth  2 (two) times daily.    [provider]  fluticasone (FLONASE) 50 MCG/ACT nasal spray Place 1-2 sprays into both nostrils daily for 7 days. 05/29/19 06/05/19  Wieters, Hallie C, PA-C  gabapentin (NEURONTIN) 100 MG capsule Take 100 mg by mouth 2 (two) times daily.    [provider]  HYDROcodone-acetaminophen (NORCO/VICODIN) 5-325 MG tablet Take 1 tablet by mouth every 6 (six) hours as needed. 09/27/21   Dorie Rank, MD  ibuprofen (ADVIL) 800 MG tablet Take 1 tablet (800 mg total) by mouth 3 (three) times daily. 09/27/21   Dorie Rank, MD  magnesium hydroxide (MILK OF MAGNESIA) 400 MG/5ML suspension Take by mouth daily as needed for mild constipation.    [provider]  metoCLOPramide (REGLAN) 10 MG tablet metoclopramide 10 mg tablet  TAKE 1 TABLET BY MOUTH THREE TIMES DAILY    [provider]  Prenatal Vit-Fe Fumarate-FA (PRENATAL MULTIVITAMIN) TABS tablet Take 1 tablet by mouth daily at 12 noon.    [provider]  vitamin E 400 UNIT capsule Take 400 Units by mouth daily. Pt bursts capsules and applies topically over scars/stretch marks    [provider]      Allergies    Bee venom and  Adhesive [tape]    Review of Systems   Review of Systems  Physical Exam Updated Vital Signs BP (!) 134/96 (BP Location: Right Arm)   Pulse (!) 126   Temp 97.9 F (36.6 C)   Resp 16   SpO2 99%  Physical Exam Vitals and nursing note reviewed.  Eyes:     Pupils: Pupils are equal, round, and reactive to light.  Cardiovascular:     Rate and Rhythm: Regular rhythm.  Abdominal:     Tenderness: There is no abdominal tenderness.  Musculoskeletal:        General: Tenderness present.     Cervical back: Neck supple.     Comments: No lumbar tenderness.  Tenderness right SI area or right buttock.  No rash.  Worse with straight leg raise.  Skin:    General: Skin is warm.     Capillary Refill: Capillary refill takes less than 2 seconds.  Neurological:      Mental Status: She is alert.     ED Results / Procedures / Treatments   Labs (all labs ordered are listed, but only abnormal results are displayed) Labs Reviewed - No data to display  EKG None  Radiology No results found.  Procedures Procedures    Medications Ordered in ED Medications  ketorolac (TORADOL) 15 MG/ML injection 15 mg (has no administration in time range)  predniSONE (DELTASONE) tablet 60 mg (has no administration in time range)    ED Course/ Medical Decision Making/ A&P                           Medical Decision Making Risk Prescription drug management.   Patient with somewhat acute on chronic right buttock pain.  Radiating down the leg.  Worse with certain movements.  Differential diagnosis includes different consummate skeletal pain.  Shingles felt less likely as there is no rash.  Discussed with patient and will give shot of Toradol and some steroids.  Will discharge with steroids and refill muscle relaxers.  We will follow-up with Dr. Lynann Bologna who she has seen in the past.  Of note patient is also tachycardic.  States she has always run fast.  States she is always in the 1 teens.  No new complaints in terms of shortness of breath or chest pain.  Had previously been on metoprolol but not currently.        Final Clinical Impression(s) / ED Diagnoses Final diagnoses:  Sciatica of right side    Rx / DC Orders ED Discharge Orders          Ordered    cyclobenzaprine (FLEXERIL) 10 MG tablet  2 times daily PRN        12/09/21 1630    predniSONE (DELTASONE) 20 MG tablet  Daily        12/09/21 1630              Davonna Belling, MD 12/09/21 1630

## 2021-12-09 NOTE — ED Triage Notes (Signed)
Right leg pain. Goes down leg. Sharp shooting pains and dull constant pains Started 2 weeks ago. Not relieved with gabapentin, muscle relaxer, or ibuprofen Hx of back surgery 2019

## 2021-12-15 DIAGNOSIS — M5416 Radiculopathy, lumbar region: Secondary | ICD-10-CM | POA: Diagnosis not present

## 2021-12-15 DIAGNOSIS — M533 Sacrococcygeal disorders, not elsewhere classified: Secondary | ICD-10-CM | POA: Diagnosis not present

## 2022-02-02 ENCOUNTER — Other Ambulatory Visit: Payer: Self-pay | Admitting: Orthopedic Surgery

## 2022-02-02 DIAGNOSIS — M545 Low back pain, unspecified: Secondary | ICD-10-CM

## 2022-02-12 ENCOUNTER — Other Ambulatory Visit: Payer: Self-pay | Admitting: Orthopedic Surgery

## 2022-02-12 DIAGNOSIS — M545 Low back pain, unspecified: Secondary | ICD-10-CM

## 2022-02-14 ENCOUNTER — Ambulatory Visit
Admission: RE | Admit: 2022-02-14 | Discharge: 2022-02-14 | Disposition: A | Discharge status: Home / Self Care | Payer: Medicaid Other | Source: Ambulatory Visit | Attending: Orthopedic Surgery | Admitting: Orthopedic Surgery

## 2022-02-14 DIAGNOSIS — M545 Low back pain, unspecified: Secondary | ICD-10-CM

## 2022-03-20 ENCOUNTER — Inpatient Hospital Stay: Admission: RE | Admit: 2022-03-20 | Payer: Medicaid Other | Source: Ambulatory Visit

## 2022-03-20 ENCOUNTER — Other Ambulatory Visit: Payer: Medicaid Other

## 2022-08-08 ENCOUNTER — Emergency Department (HOSPITAL_BASED_OUTPATIENT_CLINIC_OR_DEPARTMENT_OTHER)
Admission: EM | Admit: 2022-08-08 | Discharge: 2022-08-08 | Disposition: A | Discharge status: Home / Self Care | Payer: Medicaid Other | Attending: Emergency Medicine | Admitting: Emergency Medicine

## 2022-08-08 ENCOUNTER — Other Ambulatory Visit: Payer: Self-pay

## 2022-08-08 ENCOUNTER — Encounter (HOSPITAL_BASED_OUTPATIENT_CLINIC_OR_DEPARTMENT_OTHER): Payer: Self-pay

## 2022-08-08 DIAGNOSIS — Y92009 Unspecified place in unspecified non-institutional (private) residence as the place of occurrence of the external cause: Secondary | ICD-10-CM | POA: Diagnosis not present

## 2022-08-08 DIAGNOSIS — W010XXA Fall on same level from slipping, tripping and stumbling without subsequent striking against object, initial encounter: Secondary | ICD-10-CM | POA: Diagnosis not present

## 2022-08-08 DIAGNOSIS — S3992XA Unspecified injury of lower back, initial encounter: Secondary | ICD-10-CM | POA: Diagnosis present

## 2022-08-08 DIAGNOSIS — Z8542 Personal history of malignant neoplasm of other parts of uterus: Secondary | ICD-10-CM | POA: Diagnosis not present

## 2022-08-08 LAB — PREGNANCY, URINE: Preg Test, Ur: NEGATIVE

## 2022-08-08 MED ORDER — OXYCODONE-ACETAMINOPHEN 5-325 MG PO TABS
1.0000 | ORAL_TABLET | Freq: Once | ORAL | Status: DC
  Filled 2022-08-08: qty 1

## 2022-08-08 NOTE — ED Provider Notes (Signed)
[place] EMERGENCY DEPARTMENT AT [facility] Provider Note   CSN: [id number] Arrival date & time: 08/08/22  1623     History {Add pertinent medical, surgical, social history, OB history to HPI:1} Chief Complaint  Patient presents with  . Tailbone Pain    Holly Whitehead is a 42 y.o. female past medical history of anemia, sciatica, lumbar back pain status post lumbar laminectomy/decompression presents today for evaluation of tailbone pain.  Patient reports tailbone pain after sliding on a slip and slide 3 weeks ago.  States she fell on her buttocks and back.  States the pain got worse with prolonged sitting and positional changes.  Pain got better with standing.  States she has been trying Tylenol, ibuprofen, gabapentin, muscle relaxant with minimal relief.  Denies any urinary symptoms, urinary retention, bowel incontinence, saddle anesthesia, distal weakness.  HPI    Past Medical History:  Diagnosis Date  . Acid reflux   . Anemia   . Anxiety   . Chicken pox    as a child  . Constipation   . Headache    migraines  . Pregnancy induced hypertension   . Sciatica   . Single artery and vein of umbilical cord affecting care of newborn 11/08/2014  . SVD (spontaneous vaginal delivery) 11/08/2014  . Uterine cancer [facility])    Past Surgical History:  Procedure Laterality Date  . LUMBAR LAMINECTOMY/DECOMPRESSION MICRODISCECTOMY Right 09/24/2016   Procedure: RIGHT SIDED LUMBAR 5-SACRUM 1 MICRODISCECTOMY;  Surgeon: Estill Bamberg, MD;  Location: [facility] OR;  Service: Orthopedics;  Laterality: Right;  RIGHT SIDED LUMBAR 5-SACRUM 1 MICRODISCECTOMY     Home Medications Prior to Admission medications   Medication Sig Start Date End Date Taking? Authorizing Provider  Biotin 10 MG TABS Take 10 mg by mouth daily.    [provider]  Cetirizine HCl 10 MG CAPS Take 1 capsule (10 mg total) by mouth daily. 05/29/19   Wieters, Hallie C, PA-C  cyclobenzaprine (FLEXERIL) 10 MG tablet Take 1  tablet (10 mg total) by mouth 2 (two) times daily as needed for muscle spasms. 12/09/21   Benjiman Core, MD  cycloSPORINE (RESTASIS) 0.05 % ophthalmic emulsion Place 1 drop into both eyes 2 (two) times daily. 05/29/19   Wieters, Hallie C, PA-C  docusate sodium (COLACE) 100 MG capsule Take 100 mg by mouth 2 (two) times daily.    [provider]  fluticasone (FLONASE) 50 MCG/ACT nasal spray Place 1-2 sprays into both nostrils daily for 7 days. 05/29/19 06/05/19  Wieters, Hallie C, PA-C  gabapentin (NEURONTIN) 100 MG capsule Take 100 mg by mouth 2 (two) times daily.    [provider]  HYDROcodone-acetaminophen (NORCO/VICODIN) 5-325 MG tablet Take 1 tablet by mouth every 6 (six) hours as needed. 09/27/21   Linwood Dibbles, MD  ibuprofen (ADVIL) 800 MG tablet Take 1 tablet (800 mg total) by mouth 3 (three) times daily. 09/27/21   Linwood Dibbles, MD  magnesium hydroxide (MILK OF MAGNESIA) 400 MG/5ML suspension Take by mouth daily as needed for mild constipation.    [provider]  metoCLOPramide (REGLAN) 10 MG tablet metoclopramide 10 mg tablet  TAKE 1 TABLET BY MOUTH THREE TIMES DAILY    [provider]  predniSONE (DELTASONE) 20 MG tablet Take 2 tablets (40 mg total) by mouth daily. 12/09/21   Benjiman Core, MD  Prenatal Vit-Fe Fumarate-FA (PRENATAL MULTIVITAMIN) TABS tablet Take 1 tablet by mouth daily at 12 noon.    [provider]  vitamin E 400 UNIT capsule  Take 400 Units by mouth daily. Pt bursts capsules and applies topically over scars/stretch marks    [provider]      Allergies    Bee venom and Adhesive [tape]    Review of Systems   Review of Systems Negative except as per HPI.  Physical Exam Updated Vital Signs BP (!) 124/110   Pulse (!) 130   Temp 98.6 F (37 C) (Oral)   Resp 18   Ht 5\' 4"  (1.626 m)   Wt 123.8 kg   SpO2 96%   BMI 46.86 kg/m  Physical Exam Vitals and nursing note reviewed.  Constitutional:      Appearance:  Normal appearance.  HENT:     Head: Normocephalic and atraumatic.     Mouth/Throat:     Mouth: Mucous membranes are moist.  Eyes:     General: No scleral icterus. Cardiovascular:     Rate and Rhythm: Normal rate and regular rhythm.     Pulses: Normal pulses.     Heart sounds: Normal heart sounds.  Pulmonary:     Effort: Pulmonary effort is normal.     Breath sounds: Normal breath sounds.  Abdominal:     General: Abdomen is flat.     Palpations: Abdomen is soft.     Tenderness: There is no abdominal tenderness.  Musculoskeletal:        General: No deformity.     Comments: Tenderness to palpation to the sacrum/ coccyx area.  Skin:    General: Skin is warm.     Findings: No rash.  Neurological:     General: No focal deficit present.     Mental Status: She is alert.  Psychiatric:        Mood and Affect: Mood normal.    ED Results / Procedures / Treatments   Labs (all labs ordered are listed, but only abnormal results are displayed) Labs Reviewed  PREGNANCY, URINE    EKG None  Radiology No results found.  Procedures Procedures  {Document cardiac monitor, telemetry assessment procedure when appropriate:1}  Medications Ordered in ED Medications - No data to display  ED Course/ Medical Decision Making/ A&P   {   Click here for ABCD2, HEART and other calculatorsREFRESH Note before signing :1}                          Medical Decision Making Amount and/or Complexity of Data Reviewed Labs: ordered. Radiology: ordered.   This patient presents to the ED for ***, this involves an extensive number of treatment options, and is a complaint that carries with a high risk of complications and morbidity.  The differential diagnosis includes ***.  This is not an exhaustive list.  Lab tests: I ordered and personally interpreted labs.  The pertinent results include: WBC unremarkable. Hbg unremarkable. Platelets unremarkable. Electrolytes unremarkable. BUN, creatinine  unremarkable. ***  Imaging studies: I ordered imaging studies. I personally reviewed, interpreted imaging and agree with the radiologist's interpretations. The results include: ***   Problem list/ ED course/ Critical interventions/ Medical management: HPI: See above Vital signs ***within normal range and stable throughout visit. Laboratory/imaging studies significant for: See above. On physical examination, patient is afebrile and appears in no acute distress. *** I have reviewed the patient home medicines and have made adjustments as needed.  Cardiac monitoring/EKG: The patient was maintained on a cardiac monitor.  I personally reviewed and interpreted the cardiac monitor which showed an underlying rhythm of:  sinus rhythm.  Additional history obtained: External records from outside source obtained and reviewed including: Chart review including previous notes, labs, imaging.  Consultations obtained: I requested consultation with Dr. ***, and discussed lab and imaging findings as well as pertinent plan.  He/she ***.  Disposition Continued outpatient therapy. Follow-up with PCP*** recommended for reevaluation of symptoms. Treatment plan discussed with patient.  Pt acknowledged understanding was agreeable to the plan. Worrisome signs and symptoms were discussed with patient, and patient acknowledged understanding to return to the ED if they noticed these signs and symptoms. Patient was stable upon discharge.   This chart was dictated using voice recognition software.  Despite best efforts to proofread,  errors can occur which can change the documentation meaning.    {Document critical care time when appropriate:1} {Document review of labs and clinical decision tools ie heart score, Chads2Vasc2 etc:1}  {Document your independent review of radiology images, and any outside records:1} {Document your discussion with family members, caretakers, and with consultants:1} {Document social  determinants of health affecting pt's care:1} {Document your decision making why or why not admission, treatments were needed:1} Final Clinical Impression(s) / ED Diagnoses Final diagnoses:  None    Rx / DC Orders ED Discharge Orders     None

## 2022-08-08 NOTE — ED Triage Notes (Signed)
Pt POV from home reporting tailbone pain after sliding on slip and slide 3 weeks ago.

## 2022-08-08 NOTE — ED Notes (Signed)
Pt no longer in room. Per registration pt said she had to leave. Pt to be taken out of system

## 2022-08-09 ENCOUNTER — Emergency Department (HOSPITAL_BASED_OUTPATIENT_CLINIC_OR_DEPARTMENT_OTHER): Payer: Medicaid Other | Admitting: Radiology

## 2022-08-09 ENCOUNTER — Emergency Department (HOSPITAL_BASED_OUTPATIENT_CLINIC_OR_DEPARTMENT_OTHER): Payer: Medicaid Other

## 2022-08-09 ENCOUNTER — Encounter (HOSPITAL_BASED_OUTPATIENT_CLINIC_OR_DEPARTMENT_OTHER): Payer: Self-pay

## 2022-08-09 ENCOUNTER — Emergency Department (HOSPITAL_BASED_OUTPATIENT_CLINIC_OR_DEPARTMENT_OTHER)
Admission: EM | Admit: 2022-08-09 | Discharge: 2022-08-09 | Disposition: A | Discharge status: Home / Self Care | Payer: Medicaid Other | Attending: Emergency Medicine | Admitting: Emergency Medicine

## 2022-08-09 DIAGNOSIS — S322XXA Fracture of coccyx, initial encounter for closed fracture: Secondary | ICD-10-CM | POA: Diagnosis not present

## 2022-08-09 DIAGNOSIS — W010XXA Fall on same level from slipping, tripping and stumbling without subsequent striking against object, initial encounter: Secondary | ICD-10-CM | POA: Diagnosis not present

## 2022-08-09 DIAGNOSIS — M533 Sacrococcygeal disorders, not elsewhere classified: Secondary | ICD-10-CM | POA: Diagnosis present

## 2022-08-09 MED ORDER — IBUPROFEN 800 MG PO TABS
800.0000 mg | ORAL_TABLET | Freq: Once | ORAL | Status: AC
  Administered 2022-08-09: 800 mg via ORAL
  Filled 2022-08-09: qty 1

## 2022-08-09 MED ORDER — OXYCODONE-ACETAMINOPHEN 5-325 MG PO TABS
1.0000 | ORAL_TABLET | Freq: Once | ORAL | Status: AC
  Administered 2022-08-09: 1 via ORAL
  Filled 2022-08-09: qty 1

## 2022-08-09 MED ORDER — OXYCODONE-ACETAMINOPHEN 5-325 MG PO TABS: 1.0000 | ORAL_TABLET | Freq: Four times a day (QID) | ORAL | 0 refills | Status: DC | PRN

## 2022-08-09 NOTE — ED Notes (Signed)
Patient transported to CT 

## 2022-08-09 NOTE — ED Notes (Signed)
Patient did not take the ibuprofen. Left at bedside. Patient stated she had 1600mg  of motrin at 1300.

## 2022-08-09 NOTE — ED Provider Notes (Signed)
[place] EMERGENCY DEPARTMENT AT [facility] Provider Note   CSN: [id number] Arrival date & time: 08/09/22  1406     History  Chief Complaint  Patient presents with   Tailbone Pain    Holly Whitehead is a 42 y.o. female.  The history is provided by the patient and medical records. No language interpreter was used.     42 year old female with history of low back pain status post lumbar laminectomy and decompression by Dr. Yevette Edwards in 2018 presenting today with complaints of sacral pain.  Patient report about 2 weeks ago she fell on a slip and slide and struck her buttock.  Since then she has had persistent pain to her sacral region.  Pain is sharp throbbing worse when she sits or laying on the affected side improved when she walks.  States that she works in a job that requires a lot of sitting which aggravated her pain.  Pain does not radiate no fever chills abdominal pain dysuria bowel bladder cons or saddle anesthesia no abdominal pain.  She has not noticed any blood in the stool.  She has regular constipation which is not new but no change in her bowel bladder.  She has been taking ibuprofen, muscle relaxant, gabapentin, and tramadol without adequate relief.  Patient was seen in the ED yesterday for her complaint but had to leave early to pick up her child and did not receive x-ray was ordered.  She returns today for further assessment.  Home Medications Prior to Admission medications   Medication Sig Start Date End Date Taking? Authorizing Provider  Biotin 10 MG TABS Take 10 mg by mouth daily.    [provider]  Cetirizine HCl 10 MG CAPS Take 1 capsule (10 mg total) by mouth daily. 05/29/19   Wieters, Hallie C, PA-C  cyclobenzaprine (FLEXERIL) 10 MG tablet Take 1 tablet (10 mg total) by mouth 2 (two) times daily as needed for muscle spasms. 12/09/21   Benjiman Core, MD  cycloSPORINE (RESTASIS) 0.05 % ophthalmic emulsion Place 1 drop into both eyes 2 (two) times  daily. 05/29/19   Wieters, Hallie C, PA-C  docusate sodium (COLACE) 100 MG capsule Take 100 mg by mouth 2 (two) times daily.    [provider]  fluticasone (FLONASE) 50 MCG/ACT nasal spray Place 1-2 sprays into both nostrils daily for 7 days. 05/29/19 06/05/19  Wieters, Hallie C, PA-C  gabapentin (NEURONTIN) 100 MG capsule Take 100 mg by mouth 2 (two) times daily.    [provider]  HYDROcodone-acetaminophen (NORCO/VICODIN) 5-325 MG tablet Take 1 tablet by mouth every 6 (six) hours as needed. 09/27/21   Linwood Dibbles, MD  ibuprofen (ADVIL) 800 MG tablet Take 1 tablet (800 mg total) by mouth 3 (three) times daily. 09/27/21   Linwood Dibbles, MD  magnesium hydroxide (MILK OF MAGNESIA) 400 MG/5ML suspension Take by mouth daily as needed for mild constipation.    [provider]  metoCLOPramide (REGLAN) 10 MG tablet metoclopramide 10 mg tablet  TAKE 1 TABLET BY MOUTH THREE TIMES DAILY    [provider]  predniSONE (DELTASONE) 20 MG tablet Take 2 tablets (40 mg total) by mouth daily. 12/09/21   Benjiman Core, MD  Prenatal Vit-Fe Fumarate-FA (PRENATAL MULTIVITAMIN) TABS tablet Take 1 tablet by mouth daily at 12 noon.    [provider]  vitamin E 400 UNIT capsule Take 400 Units by mouth daily. Pt bursts capsules and applies topically over scars/stretch marks    [provider]  Allergies    Bee venom and Adhesive [tape]    Review of Systems   Review of Systems  All other systems reviewed and are negative.   Physical Exam Updated Vital Signs BP (!) 147/97 (BP Location: Right Arm)   Pulse (!) 120   Temp 98.6 F (37 C) (Oral)   Resp 16   SpO2 99%  Physical Exam Vitals and nursing note reviewed.  Constitutional:      General: She is not in acute distress.    Appearance: She is well-developed.  HENT:     Head: Atraumatic.  Eyes:     Conjunctiva/sclera: Conjunctivae normal.  Cardiovascular:     Rate and Rhythm: Tachycardia present.      Pulses: Normal pulses.     Heart sounds: Normal heart sounds.  Pulmonary:     Effort: Pulmonary effort is normal.  Abdominal:     Palpations: Abdomen is soft.     Tenderness: There is no abdominal tenderness.  Genitourinary:    Comments:  tenderness along lumbar spine and at the lumbosacral region without any bruising or deformity noted. Musculoskeletal:     Cervical back: Neck supple.  Skin:    Findings: No rash.  Neurological:     Mental Status: She is alert.     Comments: Able to ambulate.  Psychiatric:        Mood and Affect: Mood normal.     ED Results / Procedures / Treatments   Labs (all labs ordered are listed, but only abnormal results are displayed) Labs Reviewed - No data to display  EKG None  Radiology CT PELVIS WO CONTRAST  Result Date: 08/09/2022 CLINICAL DATA:  Pain after trauma EXAM: CT PELVIS WITHOUT CONTRAST TECHNIQUE: Multidetector CT imaging of the pelvis was performed following the standard protocol without intravenous contrast. RADIATION DOSE REDUCTION: This exam was performed according to the departmental dose-optimization program which includes automated exposure control, adjustment of the mA and/or kV according to patient size and/or use of iterative reconstruction technique. COMPARISON:  X-ray earlier 08/09/2022 FINDINGS: Urinary Tract: Bladder is underdistended. Preserved contour. Distal ureters are nondilated. Bowel: No oral contrast. Visualized small and large bowel in the pelvis is nondilated. Normal appendix. Vascular/Lymphatic: No pathologically enlarged lymph nodes. No significant vascular abnormality seen. Reproductive: Uterus and ovaries are preserved. No separate adnexal mass. Other:  No free fluid in the visualized pelvis. Musculoskeletal: Minimal sclerosis of the sacroiliac joints. Preserved bone mineralization. Preserved hip joints and pubic symphysis. There is cortical deformity involving the proximal aspect of the first coccygeal segment with  slight adjacent stranding. Is adjacent soft tissue thickening. IMPRESSION: Comminuted fracture of the first coccygeal segment. Adjacent soft tissue thickening. Electronically Signed   By: Karen Kays M.D.   On: 08/09/2022 16:13   DG Sacrum/Coccyx  Result Date: 08/09/2022 CLINICAL DATA:  Pain after injury. EXAM: SACRUM AND COCCYX - 3 VIEW COMPARISON:  None Available. FINDINGS: No fracture or dislocation. Preserved joint spaces and bone mineralization. If there is persistent pain or further concern of injury additional cross-sectional study as clinically directed for further sensitivity. There are some well rounded densities in the pelvis which are indeterminate although possibly vascular. IMPRESSION: No acute osseous abnormality. Electronically Signed   By: Karen Kays M.D.   On: 08/09/2022 15:16    Procedures Procedures    Medications Ordered in ED Medications - No data to display  ED Course/ Medical Decision Making/ A&P  Medical Decision Making Amount and/or Complexity of Data Reviewed Radiology: ordered.  Risk Prescription drug management.   BP (!) 147/97 (BP Location: Right Arm)   Pulse (!) 120   Temp 98.6 F (37 C) (Oral)   Resp 16   SpO2 99%   64:36 PM  42 year old female with history of low back pain status post lumbar laminectomy and decompression by Dr. Yevette Edwards in 2018 presenting today with complaints of sacral pain.  Patient report about 2 weeks ago she fell on a slip and slide and struck her buttock.  Since then she has had persistent pain to her sacral region.  Pain is sharp throbbing worse when she sits or laying on the affected side improved when she walks.  States that she works in a job that requires a lot of sitting which aggravated her pain.  Pain does not radiate no fever chills abdominal pain dysuria bowel bladder cons or saddle anesthesia no abdominal pain.  She has not noticed any blood in the stool.  She has regular constipation which  is not new but no change in her bowel bladder.  She has been taking ibuprofen, muscle relaxant, gabapentin, and tramadol without adequate relief.  Patient was seen in the ED yesterday for her complaint but had to leave early to pick up her child and did not receive x-ray was ordered.  She returns today for further assessment.  On exam this is an obese female resting comfortably in bed appears to be in no acute discomfort.  She does have some tenderness to the lumbar and lumbosacral region on palpation without overlying skin changes.  She is able to ambulate.  She is mildly tachycardic likely in the setting of pain.  -Labs ordered, independently viewed and interpreted by me.  Labs remarkable for negative preg test -The patient was maintained on a cardiac monitor.  I personally viewed and interpreted the cardiac monitored which showed an underlying rhythm of: sinus tachycarddia -Imaging independently viewed and interpreted by me and I agree with radiologist's interpretation.  Result remarkable for Xray of sacrum/coccyx region without acute changes.  Will obtain CT of pelvis for further eval. -This patient presents to the ED for concern of sacral pain, this involves an extensive number of treatment options, and is a complaint that carries with it a high risk of complications and morbidity.  The differential diagnosis includes fx, dislocation, contusion, sprain, strain -Co morbidities that complicate the patient evaluation includes obesity -Treatment includes percocet -Reevaluation of the patient after these medicines showed that the patient improved -PCP office notes or outside notes review -Escalation to admission/observation considered: patients feels much better, is comfortable with discharge, and will follow up with PCP -Prescription medication considered, patient comfortable with percocet -Social Determinant of Health considered which includes tobacco use  4:31 PM Pelvis CT demonstrated patient  does have a comminuted fracture of the first coccygeal segment with adjacent soft tissue thickening.  This is consistent with patient presentation.  I recommend sitting on a donut pillow.  Will also prescribe opiate pain medication to use as needed.  Orthopedic referral given as needed.  Appropriate care instruction provided.  Return precaution given.         Final Clinical Impression(s) / ED Diagnoses Final diagnoses:  Closed traumatic fracture of coccyx, initial encounter (HCC)    Rx / DC Orders ED Discharge Orders          Ordered    oxyCODONE-acetaminophen (PERCOCET) 5-325 MG tablet  Every 6 hours PRN  08/09/22 1624              Fayrene Helper, PA-C 08/09/22 1631    Terrilee Files, MD 08/09/22 [phone]

## 2022-08-09 NOTE — ED Triage Notes (Signed)
She c/o coccyx pain. Seen here yesterday for same.

## 2022-08-09 NOTE — Discharge Instructions (Addendum)
You have been diagnosed with a broken coccyx.  Please avoid prolonged sitting, try to sit on a donut pillow to alleviate the pressure.  You may take percocet as needed when the pain is severe but be aware it can cause drowsiness.

## 2022-08-10 ENCOUNTER — Encounter (INDEPENDENT_AMBULATORY_CARE_PROVIDER_SITE_OTHER): Payer: Self-pay

## 2022-09-03 ENCOUNTER — Other Ambulatory Visit: Payer: Self-pay

## 2022-09-03 ENCOUNTER — Emergency Department (HOSPITAL_BASED_OUTPATIENT_CLINIC_OR_DEPARTMENT_OTHER)
Admission: EM | Admit: 2022-09-03 | Discharge: 2022-09-03 | Disposition: A | Discharge status: Home / Self Care | Payer: Medicaid Other | Attending: Emergency Medicine | Admitting: Emergency Medicine

## 2022-09-03 ENCOUNTER — Encounter (HOSPITAL_BASED_OUTPATIENT_CLINIC_OR_DEPARTMENT_OTHER): Payer: Self-pay | Admitting: Emergency Medicine

## 2022-09-03 ENCOUNTER — Emergency Department (HOSPITAL_BASED_OUTPATIENT_CLINIC_OR_DEPARTMENT_OTHER): Payer: Medicaid Other | Admitting: Radiology

## 2022-09-03 DIAGNOSIS — S8991XA Unspecified injury of right lower leg, initial encounter: Secondary | ICD-10-CM | POA: Diagnosis present

## 2022-09-03 DIAGNOSIS — S59901A Unspecified injury of right elbow, initial encounter: Secondary | ICD-10-CM | POA: Diagnosis not present

## 2022-09-03 DIAGNOSIS — M545 Low back pain, unspecified: Secondary | ICD-10-CM

## 2022-09-03 DIAGNOSIS — S99922A Unspecified injury of left foot, initial encounter: Secondary | ICD-10-CM | POA: Diagnosis not present

## 2022-09-03 DIAGNOSIS — Y9301 Activity, walking, marching and hiking: Secondary | ICD-10-CM | POA: Diagnosis not present

## 2022-09-03 DIAGNOSIS — S93602A Unspecified sprain of left foot, initial encounter: Secondary | ICD-10-CM

## 2022-09-03 DIAGNOSIS — W010XXA Fall on same level from slipping, tripping and stumbling without subsequent striking against object, initial encounter: Secondary | ICD-10-CM | POA: Insufficient documentation

## 2022-09-03 DIAGNOSIS — S8001XA Contusion of right knee, initial encounter: Secondary | ICD-10-CM | POA: Diagnosis not present

## 2022-09-03 DIAGNOSIS — Y92094 Garage of other non-institutional residence as the place of occurrence of the external cause: Secondary | ICD-10-CM | POA: Insufficient documentation

## 2022-09-03 DIAGNOSIS — S8011XA Contusion of right lower leg, initial encounter: Secondary | ICD-10-CM | POA: Insufficient documentation

## 2022-09-03 DIAGNOSIS — S5001XA Contusion of right elbow, initial encounter: Secondary | ICD-10-CM

## 2022-09-03 LAB — URINALYSIS, ROUTINE W REFLEX MICROSCOPIC
Bilirubin Urine: NEGATIVE
Glucose, UA: NEGATIVE mg/dL
Hgb urine dipstick: NEGATIVE
Nitrite: NEGATIVE
Specific Gravity, Urine: 1.036 — ABNORMAL HIGH (ref 1.005–1.030)
pH: 6 (ref 5.0–8.0)

## 2022-09-03 LAB — PREGNANCY, URINE: Preg Test, Ur: NEGATIVE

## 2022-09-03 MED ORDER — KETOROLAC TROMETHAMINE 60 MG/2ML IM SOLN
60.0000 mg | Freq: Once | INTRAMUSCULAR | Status: AC
  Administered 2022-09-03: 60 mg via INTRAMUSCULAR
  Filled 2022-09-03: qty 2

## 2022-09-03 MED ORDER — OXYCODONE-ACETAMINOPHEN 5-325 MG PO TABS
2.0000 | ORAL_TABLET | Freq: Once | ORAL | Status: AC
  Administered 2022-09-03: 2 via ORAL
  Filled 2022-09-03: qty 2

## 2022-09-03 MED ORDER — OXYCODONE-ACETAMINOPHEN 5-325 MG PO TABS: 1.0000 | ORAL_TABLET | Freq: Four times a day (QID) | ORAL | 0 refills | Status: AC | PRN

## 2022-09-03 NOTE — Discharge Instructions (Addendum)
Ice for 20 minutes every 2 hours while awake for the next 2 days.  Rest.  Take ibuprofen 600 mg every 6 hours as needed for pain.  Begin taking Percocet as prescribed as needed for pain not relieved with ibuprofen.  Follow-up with primary doctor if symptoms or not improving in the next week.

## 2022-09-03 NOTE — ED Provider Notes (Signed)
[place] EMERGENCY DEPARTMENT AT [facility] Provider Note   CSN: [id number] Arrival date & time: 09/03/22  0234     History  Chief Complaint  Patient presents with   Back Pain   Fall    Holly Whitehead is a 42 y.o. female.  Patient is a 42 year old female with history of prior laminectomy, chronic pain.  Patient presenting today with complaints of a fall.  She originally had plans to come to the ER for a Toradol injection, then apparently slipped and fell in the garage and injured her right knee, left foot, and right elbow.  She has bruising in these areas and is having difficulty walking secondary to the pain.  The history is provided by the patient.       Home Medications Prior to Admission medications   Medication Sig Start Date End Date Taking? Authorizing Provider  Biotin 10 MG TABS Take 10 mg by mouth daily.    [provider]  Cetirizine HCl 10 MG CAPS Take 1 capsule (10 mg total) by mouth daily. 05/29/19   Wieters, Hallie C, PA-C  cyclobenzaprine (FLEXERIL) 10 MG tablet Take 1 tablet (10 mg total) by mouth 2 (two) times daily as needed for muscle spasms. 12/09/21   Benjiman Core, MD  cycloSPORINE (RESTASIS) 0.05 % ophthalmic emulsion Place 1 drop into both eyes 2 (two) times daily. 05/29/19   Wieters, Hallie C, PA-C  docusate sodium (COLACE) 100 MG capsule Take 100 mg by mouth 2 (two) times daily.    [provider]  fluticasone (FLONASE) 50 MCG/ACT nasal spray Place 1-2 sprays into both nostrils daily for 7 days. 05/29/19 06/05/19  Wieters, Hallie C, PA-C  gabapentin (NEURONTIN) 100 MG capsule Take 100 mg by mouth 2 (two) times daily.    [provider]  HYDROcodone-acetaminophen (NORCO/VICODIN) 5-325 MG tablet Take 1 tablet by mouth every 6 (six) hours as needed. 09/27/21   Linwood Dibbles, MD  ibuprofen (ADVIL) 800 MG tablet Take 1 tablet (800 mg total) by mouth 3 (three) times daily. 09/27/21   Linwood Dibbles, MD  magnesium hydroxide (MILK OF  MAGNESIA) 400 MG/5ML suspension Take by mouth daily as needed for mild constipation.    [provider]  metoCLOPramide (REGLAN) 10 MG tablet metoclopramide 10 mg tablet  TAKE 1 TABLET BY MOUTH THREE TIMES DAILY    [provider]  oxyCODONE-acetaminophen (PERCOCET) 5-325 MG tablet Take 1 tablet by mouth every 6 (six) hours as needed for moderate pain or severe pain. 08/09/22   Fayrene Helper, PA-C  predniSONE (DELTASONE) 20 MG tablet Take 2 tablets (40 mg total) by mouth daily. 12/09/21   Benjiman Core, MD  Prenatal Vit-Fe Fumarate-FA (PRENATAL MULTIVITAMIN) TABS tablet Take 1 tablet by mouth daily at 12 noon.    [provider]  vitamin E 400 UNIT capsule Take 400 Units by mouth daily. Pt bursts capsules and applies topically over scars/stretch marks    [provider]      Allergies    Bee venom and Adhesive [tape]    Review of Systems   Review of Systems  All other systems reviewed and are negative.   Physical Exam Updated Vital Signs BP (!) 140/96 (BP Location: Left Arm)   Pulse (!) 108   Temp 98.9 F (37.2 C)   Resp 17   Ht 5\' 4"  (1.626 m)   Wt 122.5 kg   SpO2 98%   BMI 46.35 kg/m  Physical Exam Vitals and nursing note reviewed.  Constitutional:  Appearance: Normal appearance.  HENT:     Head: Normocephalic and atraumatic.  Pulmonary:     Effort: Pulmonary effort is normal.  Musculoskeletal:     Comments: There is some ecchymosis noted to the lateral aspect of the right lower leg and knee, there is no obvious deformity and she has good range of motion.  There is swelling of the left foot with no gross deformity.  The right elbow appears grossly normal.  There is good range of motion.  Distal extremity is PMS intact.   Neurological:     Mental Status: She is alert and oriented to person, place, and time.     ED Results / Procedures / Treatments   Labs (all labs ordered are listed, but only abnormal results are  displayed) Labs Reviewed  URINALYSIS, ROUTINE W REFLEX MICROSCOPIC - Abnormal; Notable for the following components:      Result Value   Specific Gravity, Urine 1.036 (*)    Ketones, ur TRACE (*)    Protein, ur TRACE (*)    Leukocytes,Ua MODERATE (*)    Bacteria, UA RARE (*)    All other components within normal limits  PREGNANCY, URINE    EKG None  Radiology DG Foot Complete Left  Result Date: 09/03/2022 CLINICAL DATA:  Recent fall with left foot pain, initial encounter EXAM: LEFT FOOT - COMPLETE 3+ VIEW COMPARISON:  None Available. FINDINGS: Generalized soft tissue swelling is noted about the foot. No acute fracture or dislocation is seen. IMPRESSION: Soft tissue swelling without acute bony abnormality. Electronically Signed   By: Alcide Clever M.D.   On: 09/03/2022 03:49   DG Knee Complete 4 Views Right  Result Date: 09/03/2022 CLINICAL DATA:  Recent fall with knee pain, initial encounter EXAM: RIGHT KNEE - COMPLETE 4+ VIEW COMPARISON:  None Available. FINDINGS: No acute fracture or dislocation is noted. Mild soft tissue swelling is noted about the knee. IMPRESSION: No acute fractures noted. Electronically Signed   By: Alcide Clever M.D.   On: 09/03/2022 03:47   DG Elbow 2 Views Right  Result Date: 09/03/2022 CLINICAL DATA:  Recent fall with elbow pain, initial encounter EXAM: RIGHT ELBOW - 2 VIEW COMPARISON:  None Available. FINDINGS: There is no evidence of fracture, dislocation, or joint effusion. There is no evidence of arthropathy or other focal bone abnormality. Soft tissues are unremarkable. IMPRESSION: No acute abnormality noted. Electronically Signed   By: Alcide Clever M.D.   On: 09/03/2022 03:45    Procedures Procedures    Medications Ordered in ED Medications  oxyCODONE-acetaminophen (PERCOCET/ROXICET) 5-325 MG per tablet 2 tablet (has no administration in time range)  ketorolac (TORADOL) injection 60 mg (has no administration in time range)    ED Course/ Medical  Decision Making/ A&P  Patient presenting with complaints of back pain, then developed pain in her knee, foot, and elbow from a fall.  The back pain is chronic in nature and will be treated with a Toradol injection.  She has pain and swelling to the knee, foot, and elbow but x-rays are negative.  Patient be given oxycodone for this pain.  She is to ice, rest, and follow-up as needed.  Final Clinical Impression(s) / ED Diagnoses Final diagnoses:  None    Rx / DC Orders ED Discharge Orders     None         Geoffery Lyons, MD 09/03/22 [id number]

## 2022-09-03 NOTE — ED Triage Notes (Addendum)
Pt c/o worsening chronic lower back pain. Sts having difficulty with mobility. Reports urinary frequency with decreased urine output. Requesting Toradol shot as its helped in the past. Pt also sts that on her way to the ER she had a mechanical fall d/t walking in the dark. Reports pain to right knee pain, worsening back pain, left foot pain, and right elbow pain. No obvious deformity. No head injury. No LOC.

## 2022-11-13 ENCOUNTER — Ambulatory Visit: Payer: Medicaid Other | Admitting: Internal Medicine

## 2023-01-15 ENCOUNTER — Ambulatory Visit: Payer: Medicaid Other | Attending: Internal Medicine | Admitting: Internal Medicine

## 2023-01-15 NOTE — Progress Notes (Deleted)
  Cardiology Office Note:  .   Date:  01/15/2023  ID:  Holly Whitehead, DOB 01-12-1981, MRN [id number] PCP: Trisha Mangle, FNP  [facility] Providers Cardiologist:  None { Click to update primary MD,subspecialty MD or APP then REFRESH:1}   History of Present Illness: .   Holly Whitehead is a 42 y.o. female with history of preeclampsia, who is being referred from primary care [facility] for "tachycardia".  She is taking Adderall.  Her labs at that time showed normal potassium and hemoglobin.  Her TSH is normal.  She denies *** syncope, caffeine intake, etoh, family hx of SCD. She has no known structural heart dx   ROS:  per HPI otherwise negative   Studies Reviewed: .        *** Risk Assessment/Calculations:   {Does this patient have ATRIAL FIBRILLATION?:[phone]} No BP recorded.  {Refresh Note OR Click here to enter BP  :1}***       Physical Exam:   VS:  There were no vitals taken for this visit.   Wt Readings from Last 3 Encounters:  09/03/22 270 lb (122.5 kg)  08/09/22 270 lb (122.5 kg)  08/08/22 273 lb (123.8 kg)    GEN: Well nourished, well developed in no acute distress NECK: No JVD; No carotid bruits CARDIAC: ***RRR, no murmurs, rubs, gallops RESPIRATORY:  Clear to auscultation without rales, wheezing or rhonchi  ABDOMEN: Soft, non-tender, non-distended EXTREMITIES:  No edema; No deformity   ASSESSMENT AND PLAN: .   Palpitations She does not have high risk features including syncope c/f arrhythmia , family hx of SCD, or abnormalities on her EKGs.   ***We discussed cutting back on caffeine, and stress relief.  -She is on Adderall and is obese which can contribute     {Are you ordering a CV Procedure (e.g. stress test, cath, DCCV, TEE, etc)?   Press F2        :[id number]}  Dispo: ***  Signed, Maisie Fus, MD

## 2023-01-18 ENCOUNTER — Encounter: Payer: Self-pay | Admitting: Internal Medicine

## 2024-05-10 ENCOUNTER — Encounter: Admitting: Obstetrics and Gynecology
